# Patient Record
Sex: Male | Born: 1990 | Hispanic: Yes | Marital: Single | Smoking: Never smoker
Health system: Southern US, Community
[De-identification: ages and names within clinical notes are randomized; demographics above are authoritative.]

## PROBLEM LIST (undated history)

## (undated) HISTORY — PX: WISDOM TOOTH EXTRACTION: SHX21

---
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
## 2013-04-26 ENCOUNTER — Encounter (HOSPITAL_BASED_OUTPATIENT_CLINIC_OR_DEPARTMENT_OTHER): Payer: Self-pay | Admitting: Emergency Medicine

## 2013-04-26 ENCOUNTER — Emergency Department (HOSPITAL_BASED_OUTPATIENT_CLINIC_OR_DEPARTMENT_OTHER)
Admission: EM | Admit: 2013-04-26 | Discharge: 2013-04-26 | Disposition: A | Discharge status: Home / Self Care | Payer: No Typology Code available for payment source | Attending: Emergency Medicine | Admitting: Emergency Medicine

## 2013-04-26 ENCOUNTER — Emergency Department (HOSPITAL_BASED_OUTPATIENT_CLINIC_OR_DEPARTMENT_OTHER): Payer: No Typology Code available for payment source

## 2013-04-26 DIAGNOSIS — J069 Acute upper respiratory infection, unspecified: Secondary | ICD-10-CM | POA: Insufficient documentation

## 2013-04-26 DIAGNOSIS — R5381 Other malaise: Secondary | ICD-10-CM | POA: Insufficient documentation

## 2013-04-26 LAB — RAPID STREP SCREEN (MED CTR MEBANE ONLY): Streptococcus, Group A Screen (Direct): NEGATIVE

## 2013-04-26 MED ORDER — IBUPROFEN 600 MG PO TABS: 600.0000 mg | ORAL_TABLET | Freq: Four times a day (QID) | ORAL | Status: DC | PRN

## 2013-04-26 MED ORDER — BENZONATATE 100 MG PO CAPS: 100.0000 mg | ORAL_CAPSULE | Freq: Three times a day (TID) | ORAL | Status: DC

## 2013-04-26 MED ORDER — IBUPROFEN 400 MG PO TABS
600.0000 mg | ORAL_TABLET | Freq: Once | ORAL | Status: AC
  Administered 2013-04-26: 600 mg via ORAL
  Filled 2013-04-26 (×2): qty 1

## 2013-04-26 NOTE — ED Notes (Signed)
Pt reports cough, chest congestion, sore throat, fevers, fatigue, body aches x2 days

## 2013-04-26 NOTE — ED Provider Notes (Signed)
CSN: [id number]     Arrival date & time 04/26/13  0142 History   First MD Initiated Contact with Patient 04/26/13 0158     Chief Complaint  Patient presents with  . Cough  . Sore Throat   (Consider location/radiation/quality/duration/timing/severity/associated sxs/prior Treatment) HPI Patient is a 22 year old male who is normally healthy and presents with 48 hours of sore throat, subjective fevers, fatigue, nonproductive cough, body aches. Patient states he has a brother who had similar symptoms earlier in the week. He denies any neck pain or stiffness. He denies any headache. Patient has no shortness of breath or wheezing. Patient denies any abdominal pain, nausea, vomiting, diarrhea. Patient has been taking home remedies and Tylenol at at home for symptoms. History reviewed. No pertinent past medical history. History reviewed. No pertinent past surgical history. History reviewed. No pertinent family history. History  Substance Use Topics  . Smoking status: Never Smoker   . Smokeless tobacco: Never Used  . Alcohol Use: 0.6 oz/week    1 Cans of beer per week     Comment: twice a month socially    Review of Systems  Constitutional: Positive for fever, chills and fatigue.  HENT: Positive for sore throat. Negative for rhinorrhea, sinus pressure and trouble swallowing.   Respiratory: Positive for cough. Negative for shortness of breath and wheezing.   Cardiovascular: Negative for chest pain, palpitations and leg swelling.  Gastrointestinal: Negative for nausea, vomiting, abdominal pain, diarrhea and constipation.  Musculoskeletal: Negative for back pain, neck pain and neck stiffness.  Skin: Negative for rash.  Neurological: Negative for dizziness, syncope, weakness, light-headedness, numbness and headaches.  All other systems reviewed and are negative.    Allergies  Review of patient's allergies indicates no known allergies.  Home Medications  No current outpatient prescriptions  on file. BP 137/84  Pulse 82  Temp(Src) 97.9 F (36.6 C) (Oral)  Resp 18  Ht 5\' 11"  (1.803 m)  Wt 185 lb (83.915 kg)  BMI 25.81 kg/m2  SpO2 100% Physical Exam  Nursing note and vitals reviewed. Constitutional: He is oriented to person, place, and time. He appears well-developed and well-nourished. No distress.  HENT:  Head: Normocephalic and atraumatic.  Mouth/Throat: Oropharynx is clear and moist. No oropharyngeal exudate.  Patient with erythematous posterior oropharynx. No exudate noted.  Eyes: EOM are normal. Pupils are equal, round, and reactive to light.  Neck: Normal range of motion. Neck supple.  No meningismus. Mild anterior cervical lymphadenopathy.  Cardiovascular: Normal rate and regular rhythm.  Exam reveals no gallop and no friction rub.   No murmur heard. Pulmonary/Chest: Effort normal and breath sounds normal. No respiratory distress. He has no wheezes. He has no rales. He exhibits no tenderness.  Abdominal: Soft. Bowel sounds are normal. He exhibits no distension and no mass. There is no tenderness. There is no rebound and no guarding.  Musculoskeletal: Normal range of motion. He exhibits no edema and no tenderness.  No calf swelling or pain. Range of motion in all joints.  Lymphadenopathy:    He has cervical adenopathy.  Neurological: He is alert and oriented to person, place, and time.  Patient is alert and oriented x3 with clear, goal oriented speech. Patient has 5/5 motor in all extremities. Sensation is intact to light touch. Patient has a normal gait and walks without assistance.   Skin: Skin is warm and dry. No rash noted. No erythema.  Psychiatric: He has a normal mood and affect. His behavior is normal.    ED  Course  Procedures (including critical care time) Labs Review Labs Reviewed - No data to display Imaging Review No results found.  EKG Interpretation   None       MDM   Negative chest x-ray and strep screen. We'll treat symptomatically.  Patient given return precautions.   Loren Racer, MD 04/26/13 [id number]

## 2013-04-27 LAB — CULTURE, GROUP A STREP

## 2014-11-09 ENCOUNTER — Ambulatory Visit (INDEPENDENT_AMBULATORY_CARE_PROVIDER_SITE_OTHER): Payer: BLUE CROSS/BLUE SHIELD | Admitting: Family Medicine

## 2014-11-09 VITALS — BP 115/82 | HR 77 | Temp 99.1°F | Resp 14 | Ht 70.0 in | Wt 187.6 lb

## 2014-11-09 DIAGNOSIS — R05 Cough: Secondary | ICD-10-CM

## 2014-11-09 DIAGNOSIS — R059 Cough, unspecified: Secondary | ICD-10-CM

## 2014-11-09 DIAGNOSIS — J029 Acute pharyngitis, unspecified: Secondary | ICD-10-CM

## 2014-11-09 DIAGNOSIS — R52 Pain, unspecified: Secondary | ICD-10-CM | POA: Diagnosis not present

## 2014-11-09 LAB — POCT CBC
GRANULOCYTE PERCENT: 79.8 % (ref 37–80)
HCT, POC: 49.8 % (ref 43.5–53.7)
Hemoglobin: 16.9 g/dL (ref 14.1–18.1)
Lymph, poc: 3 (ref 0.6–3.4)
MCH, POC: 29.7 pg (ref 27–31.2)
MCHC: 34 g/dL (ref 31.8–35.4)
MCV: 87.2 fL (ref 80–97)
MID (cbc): 0.5 (ref 0–0.9)
MPV: 7.9 fL (ref 0–99.8)
POC Granulocyte: 13.6 — AB (ref 2–6.9)
POC LYMPH PERCENT: 17.5 %L (ref 10–50)
POC MID %: 2.7 % (ref 0–12)
Platelet Count, POC: 253 10*3/uL (ref 142–424)
RBC: 5.71 M/uL (ref 4.69–6.13)
RDW, POC: 13.2 %
WBC: 17 10*3/uL — AB (ref 4.6–10.2)

## 2014-11-09 LAB — POCT RAPID STREP A (OFFICE): RAPID STREP A SCREEN: NEGATIVE

## 2014-11-09 MED ORDER — CEFDINIR 300 MG PO CAPS: 300.0000 mg | ORAL_CAPSULE | Freq: Two times a day (BID) | ORAL | Status: DC

## 2014-11-09 NOTE — Patient Instructions (Signed)
It does appear that you have a bacterial infection- it is possible that you have pneumonia.  We are going to treat you with omnicef antibiotic twice a day for 10 days Let us know if you do not feel better in the next 1-2 days- Sooner if worse.   Ok to continue to use ibuprofen and/ or tylenol as needed for fever or other symptoms.

## 2014-11-09 NOTE — Progress Notes (Signed)
[facility] [address] [place] [address] [phone]  Date:  11/09/2014   Name:  Corey Freeman   DOB:  12-Dec-1990   MRN:  [id number]  PCP:  No PCP Per Patient    Chief Complaint: Ear Pain; Sore Throat; Cough; Generalized Body Aches; and Nasal Congestion   History of Present Illness:  Corey Freeman is a 24 y.o. very pleasant male patient who presents with the following:  Today is Wednesday. On Sunday he noted a scratchy throat and ringing in his ear.  His brother has recently been ill with pneumonia and he was not sure if this might be contagious.    At this time he has a prodictive cough, body aches.  He has a little bit of a ST now.  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  This am he noted he was coughing up material.  He took tylenol this am as well  He is generally in good health  NKDA No rash  There are no active problems to display for this patient.   History reviewed. No pertinent past medical history.  History reviewed. No pertinent past surgical history.  History  Substance Use Topics  . Smoking status: Never Smoker   . Smokeless tobacco: Never Used  . Alcohol Use: 0.6 oz/week    1 Cans of beer per week     Comment: twice a month socially    Family History  Problem Relation Age of Onset  . Diabetes Maternal Grandmother   . Hypertension Maternal Grandmother   . Diabetes Maternal Grandfather   . Diabetes Paternal Grandmother     No Known Allergies  Medication list has been reviewed and updated.  Current Outpatient Prescriptions on File Prior to Visit  Medication Sig Dispense Refill  . benzonatate (TESSALON) 100 MG capsule Take 1 capsule (100 mg total) by mouth every 8 (eight) hours. (Patient not taking: Reported on 11/09/2014) 21 capsule 0  . ibuprofen (ADVIL,MOTRIN) 600 MG tablet Take 1 tablet (600 mg total) by mouth every 6 (six) hours as needed. (Patient not taking: Reported on 11/09/2014) 30 tablet 0   No current facility-administered  medications on file prior to visit.    Review of Systems:  As per HPI- otherwise negative.   Physical Examination: Filed Vitals:   11/09/14 1310  BP: 120/74  Pulse: 98  Temp: 99.2 F (37.3 C)  Resp: 14   Filed Vitals:   11/09/14 1310  Height:  (1.778 m)  Weight: 187 lb 9.6 oz (85.095 kg)   Body mass index is 26.92 kg/(m^2). Ideal Body Weight: Weight in (lb) to have BMI = 25: 173.9  GEN: WDWN, NAD, Non-toxic, A & O x 3, looks well HEENT: Atraumatic, Normocephalic. Neck supple. No masses, No LAD.  Bilateral TM wnl, oropharynx injected but no exudate.  PEERL,EOMI.   Ears and Nose: No external deformity. CV: RRR, No M/G/R. No JVD. No thrill. No extra heart sounds. PULM: CTA B, no wheezes, crackles, rhonchi. No retractions. No resp. distress. No accessory muscle use. EXTR: No c/c/e NEURO Normal gait.  PSYCH: Normally interactive. Conversant. Not depressed or anxious appearing.  Calm demeanor.   Results for orders placed or performed in visit on 11/09/14  POCT rapid strep A  Result Value Ref Range   Rapid Strep A Screen Negative Negative  POCT CBC  Result Value Ref Range   WBC 17.0 (A) 4.6 - 10.2 K/uL   Lymph, poc 3.0 0.6 - 3.4  POC LYMPH PERCENT 17.5 10 - 50 %L   MID (cbc) 0.5 0 - 0.9   POC MID % 2.7 0 - 12 %M   POC Granulocyte 13.6 (A) 2 - 6.9   Granulocyte percent 79.8 37 - 80 %G   RBC 5.71 4.69 - 6.13 M/uL   Hemoglobin 16.9 14.1 - 18.1 g/dL   HCT, POC 16.149.8 09.643.5 - 53.7 %   MCV 87.2 80 - 97 fL   MCH, POC 29.7 27 - 31.2 pg   MCHC 34.0 31.8 - 35.4 g/dL   RDW, POC 04.513.2 %   Platelet Count, POC 253 142 - 424 K/uL   MPV 7.9 0 - 99.8 fL   Pt felt weak, sweaty after blood draw. No LOC.  Rested, drank juice/ gatorade and ate PB crackers (he had not eaten lunch and seen at approx 1:30 .  Felt better.  BP rechecked prior to DC home.  Dizziness resolved  Assessment and Plan: Acute pharyngitis, unspecified pharyngitis type - Plan: POCT rapid strep A, cefdinir  (OMNICEF) 300 MG capsule  Body aches - Plan: POCT rapid strep A, POCT CBC, cefdinir (OMNICEF) 300 MG capsule  Cough - Plan: cefdinir (OMNICEF) 300 MG capsule   Negative rapid step but sx of illness and significant leukocytosis.  Will treat with omnicef for likely bacterial infection.  He will follow-up if not better in the next 1-2 days- Sooner if worse.      Signed Abbe AmsterdamJessica Copland, MD

## 2014-11-11 ENCOUNTER — Telehealth: Payer: Self-pay | Admitting: Family Medicine

## 2014-11-11 NOTE — Telephone Encounter (Signed)
Called to check on how he is doing, LMOM.  Please let us know if not better

## 2015-01-02 ENCOUNTER — Ambulatory Visit (INDEPENDENT_AMBULATORY_CARE_PROVIDER_SITE_OTHER): Payer: BLUE CROSS/BLUE SHIELD | Admitting: Physician Assistant

## 2015-01-02 VITALS — BP 122/70 | HR 76 | Temp 98.4°F | Resp 12 | Ht 71.5 in | Wt 190.6 lb

## 2015-01-02 DIAGNOSIS — H109 Unspecified conjunctivitis: Secondary | ICD-10-CM | POA: Diagnosis not present

## 2015-01-02 NOTE — Patient Instructions (Signed)
Apply warm/cool compresses. Rewetting or lubricating eye drops. Do no apply any other eye drops. Practice good hand hygiene. Return if symptoms are not improving in 1 week. Return at any time if you develop eye pain, blurred vision, facial swelling, fever or chills. Viral Conjunctivitis Conjunctivitis is an irritation (inflammation) of the clear membrane that covers the white part of the eye (the conjunctiva). The irritation can also happen on the underside of the eyelids. Conjunctivitis makes the eye red or pink in color. This is what is commonly known as pink eye. Viral conjunctivitis can spread easily (contagious). CAUSES   Infection from virus on the surface of the eye.  Infection from the irritation or injury of nearby tissues such as the eyelids or cornea.  More serious inflammation or infection on the inside of the eye.  Other eye diseases.  The use of certain eye medications. SYMPTOMS  The normally white color of the eye or the underside of the eyelid is usually pink or red in color. The pink eye is usually associated with irritation, tearing and some sensitivity to light. Viral conjunctivitis is often associated with a clear, watery discharge. If a discharge is present, there may also be some blurred vision in the affected eye. DIAGNOSIS  Conjunctivitis is diagnosed by an eye exam. The eye specialist looks for changes in the surface tissues of the eye which take on changes characteristic of the specific types of conjunctivitis. A sample of any discharge may be collected on a Q-Tip (sterile swap). The sample will be sent to a lab to see whether or not the inflammation is caused by bacterial or viral infection. TREATMENT  Viral conjunctivitis will not respond to medicines that kill germs (antibiotics). Treatment is aimed at stopping a bacterial infection on top of the viral infection. The goal of treatment is to relieve symptoms (such as itching) with antihistamine drops or other eye  medications.  HOME CARE INSTRUCTIONS   To ease discomfort, apply a cool, clean wash cloth to your eye for 10 to 20 minutes, 3 to 4 times a day.  Gently wipe away any drainage from the eye with a warm, wet washcloth or a cotton ball.  Wash your hands often with soap and use paper towels to dry.  Do not share towels or washcloths. This may spread the infection.  Change or wash your pillowcase every day.  You should not use eye make-up until the infection is gone.  Stop using contacts lenses. Ask your eye professional how to sterilize or replace them before using again. This depends on the type of contact lenses used.  Do not touch the edge of the eyelid with the eye drop bottle or ointment tube when applying medications to the affected eye. This will stop you from spreading the infection to the other eye or to others. SEEK IMMEDIATE MEDICAL CARE IF:   The infection has not improved within 3 days of beginning treatment.  A watery discharge from the eye develops.  Pain in the eye increases.  The redness is spreading.  Vision becomes blurred.  An oral temperature above 102 F (38.9 C) develops, or as your caregiver suggests.  Facial pain, redness or swelling develops.  Any problems that may be related to the prescribed medicine develop. MAKE SURE YOU:   Understand these instructions.  Will watch your condition.  Will get help right away if you are not doing well or get worse. Document Released: 06/03/2005 Document Revised: 08/26/2011 Document Reviewed: 01/21/2008 Exit[employer] Patient Information  2015 [employer]. This information is not intended to replace advice given to you by your health care provider. Make sure you discuss any questions you have with your health care provider.  

## 2015-01-02 NOTE — Progress Notes (Signed)
[facility] [address], [place] [address] [phone]  Date:  01/02/2015   Name:  Corey Freeman   DOB:  Jan 23, 1991   MRN:  [id number]  PCP:  No PCP Per Patient    Chief Complaint: Eye Problem   History of Present Illness:  This is a 24 y.o. male who is presenting with a swollen, red and itchy right eye x 24 hours. States at start of symptoms, eye felt very dry. He has tried saline drops and and clear eye drops and no help. This morning he woke with eyelid crusting. No discharge since. Reports his eye is very itchy but no pain. Feels there is something "like an eyelash" stuck in his eye and can't get it out. No change in vision. Does not wear glasses or contacts. He denies any other symptoms - no nasal congestion, sore throat, cough, otalgia, fever or chills.  Review of Systems:  Review of Systems See HPI  There are no active problems to display for this patient.   Prior to Admission medications   Not on File    No Known Allergies  History reviewed. No pertinent past surgical history.  History  Substance Use Topics  . Smoking status: Never Smoker   . Smokeless tobacco: Never Used  . Alcohol Use: 0.6 oz/week    1 Cans of beer per week     Comment: twice a month socially    Family History  Problem Relation Age of Onset  . Diabetes Maternal Grandmother   . Hypertension Maternal Grandmother   . Diabetes Maternal Grandfather   . Diabetes Paternal Grandmother     Medication list has been reviewed and updated.  Physical Examination:  Physical Exam  Constitutional: He is oriented to person, place, and time. He appears well-developed and well-nourished. No distress.  HENT:  Head: Normocephalic and atraumatic.  Right Ear: Hearing normal.  Left Ear: Hearing normal.  Nose: Nose normal.  Mouth/Throat: Uvula is midline, oropharynx is clear and moist and mucous membranes are normal.  Eyes: EOM and lids are normal. Pupils are equal, round, and  reactive to light. Right eye exhibits no discharge. Left eye exhibits no discharge. Right conjunctiva is injected. Left conjunctiva is not injected. No scleral icterus.  Mild swelling of medial lower lid Woods lamp: no corneal uptake  Cardiovascular: Normal rate, regular rhythm and normal pulses.   Pulmonary/Chest: Effort normal. No respiratory distress.  Musculoskeletal: Normal range of motion.  Lymphadenopathy:       Head (right side): No submental, no submandibular and no tonsillar adenopathy present.       Head (left side): No submental, no submandibular and no tonsillar adenopathy present.    He has no cervical adenopathy.  Neurological: He is alert and oriented to person, place, and time.  Skin: Skin is warm, dry and intact. No lesion and no rash noted.  Psychiatric: He has a normal mood and affect. His speech is normal and behavior is normal. Thought content normal.   BP 122/70 mmHg  Pulse 76  Temp(Src) 98.4 F (36.9 C) (Oral)  Resp 12  Ht 5' 11.5" (1.816 m)  Wt 190 lb 9.6 oz (86.456 kg)  BMI 26.22 kg/m2  SpO2 98%   Visual Acuity Screening   Right eye Left eye Both eyes  Without correction:  With correction:      Assessment and Plan:  1. Conjunctivitis of right eye Etiology likely viral. Counseled on cool/warm compresses, lubricating eye  drops and hand hygiene. Return in 1 week if symptoms not improving or at any time if symptoms worsen.   Roswell MinersNicole V. Dyke BrackettBush, PA-C, MHS [facility]  01/02/2015

## 2015-01-03 NOTE — Progress Notes (Signed)
  Medical screening examination/treatment/procedure(s) were performed by non-physician practitioner and as supervising physician I was immediately available for consultation/collaboration.     

## 2015-06-20 ENCOUNTER — Emergency Department (HOSPITAL_BASED_OUTPATIENT_CLINIC_OR_DEPARTMENT_OTHER)
Admission: EM | Admit: 2015-06-20 | Discharge: 2015-06-20 | Disposition: A | Discharge status: Home / Self Care | Payer: BLUE CROSS/BLUE SHIELD | Attending: Emergency Medicine | Admitting: Emergency Medicine

## 2015-06-20 ENCOUNTER — Emergency Department (HOSPITAL_BASED_OUTPATIENT_CLINIC_OR_DEPARTMENT_OTHER): Payer: BLUE CROSS/BLUE SHIELD

## 2015-06-20 ENCOUNTER — Encounter (HOSPITAL_BASED_OUTPATIENT_CLINIC_OR_DEPARTMENT_OTHER): Payer: Self-pay

## 2015-06-20 DIAGNOSIS — R110 Nausea: Secondary | ICD-10-CM | POA: Diagnosis not present

## 2015-06-20 DIAGNOSIS — R202 Paresthesia of skin: Secondary | ICD-10-CM | POA: Diagnosis not present

## 2015-06-20 DIAGNOSIS — R51 Headache: Secondary | ICD-10-CM | POA: Insufficient documentation

## 2015-06-20 DIAGNOSIS — M79642 Pain in left hand: Secondary | ICD-10-CM | POA: Insufficient documentation

## 2015-06-20 DIAGNOSIS — E876 Hypokalemia: Secondary | ICD-10-CM | POA: Diagnosis not present

## 2015-06-20 DIAGNOSIS — R42 Dizziness and giddiness: Secondary | ICD-10-CM | POA: Diagnosis not present

## 2015-06-20 DIAGNOSIS — M791 Myalgia: Secondary | ICD-10-CM | POA: Diagnosis not present

## 2015-06-20 LAB — CBC WITH DIFFERENTIAL/PLATELET
BASOS ABS: 0.1 10*3/uL (ref 0.0–0.1)
Basophils Relative: 1 %
EOS PCT: 2 %
Eosinophils Absolute: 0.3 10*3/uL (ref 0.0–0.7)
HCT: 44.8 % (ref 39.0–52.0)
HEMOGLOBIN: 15.5 g/dL (ref 13.0–17.0)
LYMPHS PCT: 41 %
Lymphs Abs: 5.9 10*3/uL — ABNORMAL HIGH (ref 0.7–4.0)
MCH: 29.6 pg (ref 26.0–34.0)
MCHC: 34.6 g/dL (ref 30.0–36.0)
MCV: 85.7 fL (ref 78.0–100.0)
MONOS PCT: 11 %
Monocytes Absolute: 1.6 10*3/uL — ABNORMAL HIGH (ref 0.1–1.0)
Neutro Abs: 6.6 10*3/uL (ref 1.7–7.7)
Neutrophils Relative %: 45 %
Platelets: 292 10*3/uL (ref 150–400)
RBC: 5.23 MIL/uL (ref 4.22–5.81)
RDW: 13.5 % (ref 11.5–15.5)
WBC: 14.5 10*3/uL — AB (ref 4.0–10.5)

## 2015-06-20 LAB — BASIC METABOLIC PANEL
ANION GAP: 10 (ref 5–15)
BUN: 18 mg/dL (ref 6–20)
CO2: 23 mmol/L (ref 22–32)
CREATININE: 0.99 mg/dL (ref 0.61–1.24)
Calcium: 8.9 mg/dL (ref 8.9–10.3)
Chloride: 104 mmol/L (ref 101–111)
GFR calc Af Amer: >60 mL/min (ref >60)
Glucose, Bld: 133 mg/dL — ABNORMAL HIGH (ref 65–99)
POTASSIUM: 2.9 mmol/L — AB (ref 3.5–5.1)
SODIUM: 137 mmol/L (ref 135–145)

## 2015-06-20 LAB — RAPID URINE DRUG SCREEN, HOSP PERFORMED
Amphetamines: NOT DETECTED
Barbiturates: NOT DETECTED
Benzodiazepines: NOT DETECTED
COCAINE: NOT DETECTED
Opiates: NOT DETECTED
TETRAHYDROCANNABINOL: NOT DETECTED

## 2015-06-20 MED ORDER — POTASSIUM CHLORIDE CRYS ER 20 MEQ PO TBCR
40.0000 meq | EXTENDED_RELEASE_TABLET | Freq: Once | ORAL | Status: AC
  Administered 2015-06-20: 40 meq via ORAL
  Filled 2015-06-20: qty 2

## 2015-06-20 MED ORDER — ONDANSETRON 4 MG PO TBDP
4.0000 mg | ORAL_TABLET | Freq: Once | ORAL | Status: AC
  Administered 2015-06-20: 4 mg via ORAL
  Filled 2015-06-20: qty 1

## 2015-06-20 MED ORDER — SODIUM CHLORIDE 0.9 % IV BOLUS (SEPSIS)
1000.0000 mL | Freq: Once | INTRAVENOUS | Status: AC
  Administered 2015-06-20: 1000 mL via INTRAVENOUS

## 2015-06-20 MED ORDER — MECLIZINE HCL 25 MG PO TABS
25.0000 mg | ORAL_TABLET | Freq: Once | ORAL | Status: AC
  Administered 2015-06-20: 25 mg via ORAL
  Filled 2015-06-20: qty 1

## 2015-06-20 MED ORDER — MECLIZINE HCL 25 MG PO TABS: 25.0000 mg | ORAL_TABLET | Freq: Three times a day (TID) | ORAL | Status: DC | PRN

## 2015-06-20 MED FILL — MECLIZINE 25 MG TABLET: 25 | 10 days supply | Qty: 30 | Fill #0

## 2015-06-20 NOTE — ED Notes (Signed)
Pt reports 1.5 hrs ago he had a cup of coffee on the couch, states he got up and became dizzy, lightheaded. Has associated left arm pain, nausea, "states the room is spinning and my legs are like jelly."

## 2015-06-20 NOTE — ED Notes (Signed)
Patient transported to CT 

## 2015-06-20 NOTE — ED Notes (Signed)
Pt. Has episode of dizziness and vision disturbance with bicep numbness in the L bicep while RN at bedside.  RN Earlene Plateravis stayed with Pt.  Pt. HR at 101 with no distress noted in other vitals.  Pt. Status reported to Dr. Wilkie AyeHorton.  New orders place.

## 2015-06-20 NOTE — Discharge Instructions (Signed)
Benign Positional Vertigo °Vertigo is the feeling that you or your surroundings are moving when they are not. Benign positional vertigo is the most common form of vertigo. The cause of this condition is not serious (is benign). This condition is triggered by certain movements and positions (is positional). This condition can be dangerous if it occurs while you are doing something that could endanger you or others, such as driving.  °CAUSES °In many cases, the cause of this condition is not known. It may be caused by a disturbance in an area of the inner ear that helps your brain to sense movement and balance. This disturbance can be caused by a viral infection (labyrinthitis), head injury, or repetitive motion. °RISK FACTORS °This condition is more likely to develop in: °· Women. °· People who are 50 years of age or older. °SYMPTOMS °Symptoms of this condition usually happen when you move your head or your eyes in different directions. Symptoms may start suddenly, and they usually last for less than a minute. Symptoms may include: °· Loss of balance and falling. °· Feeling like you are spinning or moving. °· Feeling like your surroundings are spinning or moving. °· Nausea and vomiting. °· Blurred vision. °· Dizziness. °· Involuntary eye movement (nystagmus). °Symptoms can be mild and cause only slight annoyance, or they can be severe and interfere with daily life. Episodes of benign positional vertigo may return (recur) over time, and they may be triggered by certain movements. Symptoms may improve over time. °DIAGNOSIS °This condition is usually diagnosed by medical history and a physical exam of the head, neck, and ears. You may be referred to a health care provider who specializes in ear, nose, and throat (ENT) problems (otolaryngologist) or a provider who specializes in disorders of the nervous system (neurologist). You may have additional testing, including: °· MRI. °· A CT scan. °· Eye movement tests. Your  health care provider may ask you to change positions quickly while he or she watches you for symptoms of benign positional vertigo, such as nystagmus. Eye movement may be tested with an electronystagmogram (ENG), caloric stimulation, the Dix-Hallpike test, or the roll test. °· An electroencephalogram (EEG). This records electrical activity in your brain. °· Hearing tests. °TREATMENT °Usually, your health care provider will treat this by moving your head in specific positions to adjust your inner ear back to normal. Surgery may be needed in severe cases, but this is rare. In some cases, benign positional vertigo may resolve on its own in 2-4 weeks. °HOME CARE INSTRUCTIONS °Safety °· Move slowly. Avoid sudden body or head movements. °· Avoid driving. °· Avoid operating heavy machinery. °· Avoid doing any tasks that would be dangerous to you or others if a vertigo episode would occur. °· If you have trouble walking or keeping your balance, try using a cane for stability. If you feel dizzy or unstable, sit down right away. °· Return to your normal activities as told by your health care provider. Ask your health care provider what activities are safe for you. °General Instructions °· Take over-the-counter and prescription medicines only as told by your health care provider. °· Avoid certain positions or movements as told by your health care provider. °· Drink enough fluid to keep your urine clear or pale yellow. °· Keep all follow-up visits as told by your health care provider. This is important. °SEEK MEDICAL CARE IF: °· You have a fever. °· Your condition gets worse or you develop new symptoms. °· Your family or friends   notice any behavioral changes.  Your nausea or vomiting gets worse.  You have numbness or a "pins and needles" sensation. SEEK IMMEDIATE MEDICAL CARE IF:  You have difficulty speaking or moving.  You are always dizzy.  You faint.  You develop severe headaches.  You have weakness in your  legs or arms.  You have changes in your hearing or vision.  You develop a stiff neck.  You develop sensitivity to light.   This information is not intended to replace advice given to you by your health care provider. Make sure you discuss any questions you have with your health care provider.   Document Released: 03/11/2006 Document Revised: 02/22/2015 Document Reviewed: 09/26/2014 Elsevier Interactive Patient Education 2016 [employer] Hypokalemia Hypokalemia means that the amount of potassium in the blood is lower than normal.Potassium is a chemical, called an electrolyte, that helps regulate the amount of fluid in the body. It also stimulates muscle contraction and helps nerves function properly.Most of the body's potassium is inside of cells, and only a very small amount is in the blood. Because the amount in the blood is so small, minor changes can be life-threatening. CAUSES  Antibiotics.  Diarrhea or vomiting.  Using laxatives too much, which can cause diarrhea.  Chronic kidney disease.  Water pills (diuretics).  Eating disorders (bulimia).  Low magnesium level.  Sweating a lot. SIGNS AND SYMPTOMS  Weakness.  Constipation.  Fatigue.  Muscle cramps.  Mental confusion.  Skipped heartbeats or irregular heartbeat (palpitations).  Tingling or numbness. DIAGNOSIS  Your health care provider can diagnose hypokalemia with blood tests. In addition to checking your potassium level, your health care provider may also check other lab tests. TREATMENT Hypokalemia can be treated with potassium supplements taken by mouth or adjustments in your current medicines. If your potassium level is very low, you may need to get potassium through a vein (IV) and be monitored in the hospital. A diet high in potassium is also helpful. Foods high in potassium are:  Nuts, such as peanuts and pistachios.  Seeds, such as sunflower seeds and pumpkin seeds.  Peas, lentils, and lima  beans.  Whole grain and bran cereals and breads.  Fresh fruit and vegetables, such as apricots, avocado, bananas, cantaloupe, kiwi, oranges, tomatoes, asparagus, and potatoes.  Orange and tomato juices.  Red meats.  Fruit yogurt. HOME CARE INSTRUCTIONS  Take all medicines as prescribed by your health care provider.  Maintain a healthy diet by including nutritious food, such as fruits, vegetables, nuts, whole grains, and lean meats.  If you are taking a laxative, be sure to follow the directions on the label. SEEK MEDICAL CARE IF:  Your weakness gets worse.  You feel your heart pounding or racing.  You are vomiting or having diarrhea.  You are diabetic and having trouble keeping your blood glucose in the normal range. SEEK IMMEDIATE MEDICAL CARE IF:  You have chest pain, shortness of breath, or dizziness.  You are vomiting or having diarrhea for more than 2 days.  You faint. MAKE SURE YOU:   Understand these instructions.  Will watch your condition.  Will get help right away if you are not doing well or get worse.   This information is not intended to replace advice given to you by your health care provider. Make sure you discuss any questions you have with your health care provider.   Document Released: 06/03/2005 Document Revised: 06/24/2014 Document Reviewed: 12/04/2012 Elsevier Interactive Patient Education [employer] Elsevier Inc.

## 2015-06-20 NOTE — ED Provider Notes (Signed)
CSN: [id number]     Arrival date & time 06/20/15  0022 History  By signing my name below, I, Corey Freeman, attest that this documentation has been prepared under the direction and in the presence of Shon Baton, MD. Electronically Signed: Budd Freeman, ED Scribe. 06/20/2015. 12:51 AM.     Chief Complaint  Patient presents with  . Dizziness   The history is provided by the patient. No language interpreter was used.   HPI Comments: Corey Freeman is a 25 y.o. male who presents to the Emergency Department complaining of room-spinning dizziness onset 20 minutes ago. Pt states he was sitting on the couch drinking a coffee when he stood up and felt dizzy and lightheaded. Patient reports room spinning dizziness. Has had several episodes similar to this in the past. Mother with a history of vertigo. He reports associated weakness, pain and tingling in his left biceps, and nausea. He also endorses occasional headaches after standing up rapidly. He notes he typically drinks coffee once or twice per week. He reports a FHx of vertigo (mother). He denies exacerbation of the dizziness with head movement, but states that sometimes when lying supine in the past, he also felt dizzy. He denies recent illness or hard work in the heat. He denies recent travel or injury as well as a PMHx of DVT or PE. Pt denies vomiting, fever, and cough. Denies vision changes.  History reviewed. No pertinent past medical history. History reviewed. No pertinent past surgical history. Family History  Problem Relation Age of Onset  . Diabetes Maternal Grandmother   . Hypertension Maternal Grandmother   . Diabetes Maternal Grandfather   . Diabetes Paternal Grandmother    Social History  Substance Use Topics  . Smoking status: Never Smoker   . Smokeless tobacco: Never Used  . Alcohol Use: 0.6 oz/week    1 Cans of beer per week     Comment: twice a month socially    Review of Systems  Constitutional: Negative for fever.   Eyes: Negative for visual disturbance.  Respiratory: Negative for cough.   Gastrointestinal: Positive for nausea. Negative for vomiting.  Musculoskeletal: Positive for myalgias.  Neurological: Positive for dizziness and light-headedness.  All other systems reviewed and are negative.   Allergies  Review of patient's allergies indicates no known allergies.  Home Medications   Prior to Admission medications   Medication Sig Start Date End Date Taking? Authorizing Provider  meclizine (ANTIVERT) 25 MG tablet Take 1 tablet (25 mg total) by mouth 3 (three) times daily as needed for dizziness. 06/20/15   Shon Baton, MD   BP 121/85 mmHg  Pulse 88  Temp(Src) 98.1 F (36.7 C) (Oral)  Resp 19  Ht 5\' 10"  (1.778 m)  Wt 190 lb (86.183 kg)  BMI 27.26 kg/m2  SpO2 97% Physical Exam  Constitutional: He is oriented to person, place, and time. He appears well-developed and well-nourished. No distress.  HENT:  Head: Normocephalic and atraumatic.  Right Ear: External ear normal.  Left Ear: External ear normal.  Mouth/Throat: Oropharynx is clear and moist.  Eyes: EOM are normal. Pupils are equal, round, and reactive to light.  No nystagmus appreciated  Cardiovascular: Normal rate, regular rhythm and normal heart sounds.   No murmur heard. Pulmonary/Chest: Effort normal and breath sounds normal. No respiratory distress. He has no wheezes.  Abdominal: Soft. Bowel sounds are normal. There is no tenderness. There is no rebound.  Musculoskeletal: He exhibits no edema.  Neurological: He is alert  and oriented to person, place, and time.  No dysmetria to finger-nose-finger, no drift noted, cranial nerves II through XII intact  Skin: Skin is warm and dry.  Psychiatric: He has a normal mood and affect.  Nursing note and vitals reviewed.   ED Course  Procedures  DIAGNOSTIC STUDIES: Oxygen Saturation is 99% on RA, normal by my interpretation.    COORDINATION OF CARE: 12:49 AM - Discussed  possible vertigo. Discussed plans to order diagnostic studies. Pt advised of plan for treatment and pt agrees.  Labs Review Labs Reviewed  CBC WITH DIFFERENTIAL/PLATELET - Abnormal; Notable for the following:    WBC 14.5 (*)    Lymphs Abs 5.9 (*)    Monocytes Absolute 1.6 (*)    All other components within normal limits  BASIC METABOLIC PANEL - Abnormal; Notable for the following:    Potassium 2.9 (*)    Glucose, Bld 133 (*)    All other components within normal limits  URINE RAPID DRUG SCREEN, HOSP PERFORMED    Imaging Review Ct Head Wo Contrast  06/20/2015  CLINICAL DATA:  Acute onset of dizziness, blurred vision and nausea. Initial encounter. EXAM: CT HEAD WITHOUT CONTRAST TECHNIQUE: Contiguous axial images were obtained from the base of the skull through the vertex without intravenous contrast. COMPARISON:  None. FINDINGS: There is no evidence of acute infarction, mass lesion, or intra- or extra-axial hemorrhage on CT. The posterior fossa, including the cerebellum, brainstem and fourth ventricle, is within normal limits. The third and lateral ventricles, and basal ganglia are unremarkable in appearance. The cerebral hemispheres are symmetric in appearance, with normal gray-white differentiation. No mass effect or midline shift is seen. There is no evidence of fracture; visualized osseous structures are unremarkable in appearance. The visualized portions of the orbits are within normal limits. The paranasal sinuses and mastoid air cells are well-aerated. No significant soft tissue abnormalities are seen. IMPRESSION: Unremarkable noncontrast CT of the head. Electronically Signed   By: Roanna RaiderJeffery  Chang M.D.   On: 06/20/2015 02:12   I have personally reviewed and evaluated these images and lab results as part of my medical decision-making.   EKG Interpretation   Date/Time:  Tuesday June 20 2015 00:36:55 EST Ventricular Rate:  100 PR Interval:  156 QRS Duration: 95 QT Interval:  347 QTC  Calculation: 447 R Axis:   78 Text Interpretation:  Sinus tachycardia Confirmed by Adlai Nieblas  MD, Erlinda Solinger  ([id number]) on 06/20/2015 12:59:57 AM      MDM   Final diagnoses:  Vertigo  Hypokalemia   Patient presents with room spinning dizziness. Nontoxic on exam. Nonfocal. Reports recurrent symptoms previously. Neurologically intact. No cerebellar dysfunction. Suspect peripheral vertigo. Patient given meclizine and Zofran.  Given tingling in left arm, basic labwork obtained to assess metabolic derangement. Potassium low at 2.9. This was replaced. EKG reassuring. Patient is not orthostatic.  1:19 AM Was just informed by nursing that patient complaining of worsening dizziness. Unable to perform orthostatics. Also reports blurred vision. Patient just received meclizine.  2:47 AM CT negative. Patient reports improvement of dizziness. He is able to ambulate without difficulty. Suspect vertigo. Discussed with patient treatment. Patient was also encouraged to eat high potassium foods given his mild hypokalemia. Patient stated understanding. He will be given ENT follow-up.  After history, exam, and medical workup I feel the patient has been appropriately medically screened and is safe for discharge home. Pertinent diagnoses were discussed with the patient. Patient was given return precautions.  I personally performed the services  described in this documentation, which was scribed in my presence. The recorded information has been reviewed and is accurate.   Shon Baton, MD 06/20/15 [phone]

## 2015-06-24 ENCOUNTER — Emergency Department (HOSPITAL_BASED_OUTPATIENT_CLINIC_OR_DEPARTMENT_OTHER)
Admission: EM | Admit: 2015-06-24 | Discharge: 2015-06-25 | Disposition: A | Discharge status: Home / Self Care | Payer: BLUE CROSS/BLUE SHIELD | Attending: Emergency Medicine | Admitting: Emergency Medicine

## 2015-06-24 DIAGNOSIS — M25522 Pain in left elbow: Secondary | ICD-10-CM | POA: Diagnosis present

## 2015-06-24 DIAGNOSIS — E876 Hypokalemia: Secondary | ICD-10-CM | POA: Diagnosis not present

## 2015-06-24 DIAGNOSIS — M79602 Pain in left arm: Secondary | ICD-10-CM | POA: Diagnosis not present

## 2015-06-25 ENCOUNTER — Encounter (HOSPITAL_BASED_OUTPATIENT_CLINIC_OR_DEPARTMENT_OTHER): Payer: Self-pay | Admitting: *Deleted

## 2015-06-25 LAB — MAGNESIUM: MAGNESIUM: 2.2 mg/dL (ref 1.7–2.4)

## 2015-06-25 LAB — BASIC METABOLIC PANEL
Anion gap: 8 (ref 5–15)
BUN: 16 mg/dL (ref 6–20)
CHLORIDE: 104 mmol/L (ref 101–111)
CO2: 29 mmol/L (ref 22–32)
Calcium: 10.1 mg/dL (ref 8.9–10.3)
Creatinine, Ser: 0.77 mg/dL (ref 0.61–1.24)
GFR calc Af Amer: >60 mL/min (ref >60)
Glucose, Bld: 99 mg/dL (ref 65–99)
POTASSIUM: 3.7 mmol/L (ref 3.5–5.1)
SODIUM: 141 mmol/L (ref 135–145)

## 2015-06-25 NOTE — ED Provider Notes (Signed)
CSN: [id number]     Arrival date & time 06/24/15  2343 History   First MD Initiated Contact with Patient 06/25/15 0015     Chief Complaint  Patient presents with  . Arm Pain     (Consider location/radiation/quality/duration/timing/severity/associated sxs/prior Treatment) HPI  This is a healthy 25 year old male who was seen in the department on the third of this month for dizziness and pain in his left elbow. He was diagnosed with vertigo and hypokalemia. He was given 1 dose of potassium chloride in the department and given a prescription for meclizine. His vertigo has improved.   He returns with episodic pain in his left elbow, primarily in the antecubital fossa but radiating to the olecranon as well. These episodes last about 30 minutes. Nothing brings them on or makes them better. There is no tenderness, swelling or pain with movement of the elbow, left shoulder or neck. He has also been having occasional jerks in his left pectoralis major. He rates his pain as a 3 out of 10 at its worst. He denies fever, chills, chest pain, shortness of breath, nausea, vomiting, diarrhea or trauma.  History reviewed. No pertinent past medical history. History reviewed. No pertinent past surgical history. Family History  Problem Relation Age of Onset  . Diabetes Maternal Grandmother   . Hypertension Maternal Grandmother   . Diabetes Maternal Grandfather   . Diabetes Paternal Grandmother    Social History  Substance Use Topics  . Smoking status: Never Smoker   . Smokeless tobacco: Never Used  . Alcohol Use: 0.6 oz/week    1 Cans of beer per week     Comment: twice a month socially    Review of Systems  All other systems reviewed and are negative.   Allergies  Review of patient's allergies indicates no known allergies.  Home Medications   Prior to Admission medications   Medication Sig Start Date End Date Taking? Authorizing Provider  meclizine (ANTIVERT) 25 MG tablet Take 1 tablet (25 mg  total) by mouth 3 (three) times daily as needed for dizziness. 06/20/15   Shon Baton, MD   BP 148/81 mmHg  Pulse 92  Temp(Src) 99 F (37.2 C) (Oral)  Resp 16  Ht 5\' 10"  (1.778 m)  Wt 193 lb (87.544 kg)  BMI 27.69 kg/m2  SpO2 99%   Physical Exam  General: Well-developed, well-nourished male in no acute distress; appearance consistent with age of record HENT: normocephalic; atraumatic Eyes: pupils equal, round and reactive to light; extraocular muscles intact Neck: supple Heart: regular rate and rhythm Lungs: clear to auscultation bilaterally Abdomen: soft; nondistended; nontender; bowel sounds present Extremities: No deformity; full range of motion; pulses normal; no tenderness or swelling of the musculature of the left arm, no bony tenderness of the left elbow, no pain on passive range of motion; no tenderness of the left pectoralis muscles Neurologic: Awake, alert and oriented; motor function intact in all extremities and symmetric; no facial droop Skin: Warm and dry Psychiatric: Normal mood and affect   ED Course  Procedures (including critical care time)   MDM  Nursing notes and vitals signs, including pulse oximetry, reviewed.  Summary of this visit's results, reviewed by myself:  Labs:  Results for orders placed or performed during the hospital encounter of 06/24/15 (from the past 24 hour(s))  Basic metabolic panel     Status: None   Collection Time: 06/25/15 12:30 AM  Result Value Ref Range   Sodium 141 135 - 145 mmol/L  Potassium 3.7 3.5 - 5.1 mmol/L   Chloride 104 101 - 111 mmol/L   CO2 29 22 - 32 mmol/L   Glucose, Bld 99 65 - 99 mg/dL   BUN 16 6 - 20 mg/dL   Creatinine, Ser 7.820.77 0.61 - 1.24 mg/dL   Calcium 95.610.1 8.9 - 21.310.3 mg/dL   GFR calc non Af Amer >60 >60 mL/min   GFR calc Af Amer >60 >60 mL/min   Anion gap 8 5 - 15  Magnesium     Status: None   Collection Time: 06/25/15 12:30 AM  Result Value Ref Range   Magnesium 2.2 1.7 - 2.4 mg/dL    Patient advised of improved potassium level. The cause of his pain is not obvious at this time. He has a follow-up appointment scheduled for the day after tomorrow at [facility]   Paula LibraJohn Autumnrose Yore, MD 06/25/15 [phone]

## 2015-06-25 NOTE — ED Notes (Signed)
Denies questions or needs, alert, NAD< calm, VSS.

## 2015-06-25 NOTE — ED Notes (Addendum)
C/o L distal bicep and tricep pain, sometimes feels it in his L axillary pectoralis muscle, onset 1 hr ago, constant, not affected by use movement or palpation. Occurred last week also. Reports was seen here for the same in addition to dizziness last week. Dizziness resolved/ improved. (denies: ice, meds or topicals applied). Mentions "lifts some minimal weights maybe once per week". States, "has tried to hydrate and increase potassium in diet". (Denies: nvd, fever, dizziness, spasms/ cramping, numbness/ tingling, known strain or injury.

## 2015-06-27 ENCOUNTER — Ambulatory Visit (INDEPENDENT_AMBULATORY_CARE_PROVIDER_SITE_OTHER): Payer: BLUE CROSS/BLUE SHIELD | Admitting: Family Medicine

## 2015-06-27 ENCOUNTER — Encounter: Payer: Self-pay | Admitting: Family Medicine

## 2015-06-27 VITALS — BP 133/74 | HR 94 | Temp 99.7°F | Resp 16 | Ht 70.0 in | Wt 196.0 lb

## 2015-06-27 DIAGNOSIS — H8110 Benign paroxysmal vertigo, unspecified ear: Secondary | ICD-10-CM

## 2015-06-27 DIAGNOSIS — E876 Hypokalemia: Secondary | ICD-10-CM | POA: Diagnosis not present

## 2015-06-27 NOTE — Progress Notes (Signed)
   Subjective:    Patient ID: Corey Freeman, male    DOB: 02/22/1991, 25 y.o.   MRN: [id number]  HPI This is a pleasant 25 yo male who is accompanied by his girlfriend. He presents for follow up of recent ED visit and hypokalemia.   He was seen 06/20/15 in the ED with vertigo. Head CT was negative. At that time he was found to have a potassium of 2.9. He has had 2 more episodes of vertigo relieved with meclazine. Subsequent episodes were shorter than initial episode. He had feelings of weakness in his legs which have now resolved.   He was seen 06/24/15 with left arm pain, he was concerned this was related to his heart. He had normal EKG, negative troponin. Repeat potassium 3.7. Arm pain since resolved.   No past medical history on file. No past surgical history on file. Family History  Problem Relation Age of Onset  . Diabetes Maternal Grandmother   . Hypertension Maternal Grandmother   . Diabetes Maternal Grandfather   . Diabetes Paternal Grandmother    Social History  Substance Use Topics  . Smoking status: Never Smoker   . Smokeless tobacco: Never Used  . Alcohol Use: 0.6 oz/week    1 Cans of beer per week     Comment: twice a month socially   Medications, allergies, past medical history, surgical history, family history, social history and problem list reviewed and updated.  Review of Systems No chest pain, some SOB with sitting- has to take a deep breath, none with activity. Goes to the gym regularly, never has SOB or chest pain. No cough, no runny nose, no sore throat, no ear pain. No abdominal pain, no diarrhea, no constipation.     Objective:   Physical Exam  Constitutional: He is oriented to person, place, and time. He appears well-developed and well-nourished.  HENT:  Head: Normocephalic and atraumatic.  Right Ear: Tympanic membrane, external ear and ear canal normal.  Left Ear: Tympanic membrane, external ear and ear canal normal.  Nose: Nose normal.  Mouth/Throat:  Oropharynx is clear and moist.  Eyes: Conjunctivae are normal. Pupils are equal, round, and reactive to light.  Neck: Normal range of motion. Neck supple.  Cardiovascular: Normal rate, regular rhythm and normal heart sounds.   Pulmonary/Chest: Effort normal and breath sounds normal.  Musculoskeletal: Normal range of motion.  Lymphadenopathy:    He has no cervical adenopathy.  Neurological: He is alert and oriented to person, place, and time.  Skin: Skin is warm and dry.  Psychiatric: He has a normal mood and affect. His behavior is normal. Judgment and thought content normal.  Vitals reviewed.   BP 133/74 mmHg  Pulse 94  Temp(Src) 99.7 F (37.6 C)  Resp 16  Ht 5\' 10"  (1.778 m)  Wt 196 lb (88.905 kg)  BMI 28.12 kg/m2     Assessment & Plan:  1. BPV (benign positional vertigo), unspecified laterality - provided patient information regarding Epley maneuvers  2. Hypokalemia - last potassium level normal, no current symptoms of hypokalemia - instructed patient to eat healthy diet with adequate fruits and vegetable - RTC if he has any weakness, muscle cramps, palpitations  - discussed general healthy maintenance, importance of maintaining healthy weight, getting adequate sleep, managing stress  - follow up PRN   Olean Reeeborah Gessner, FNP-BC  [facility]  06/30/2015 6:40 AM

## 2015-06-27 NOTE — Patient Instructions (Signed)
Epley Maneuver Self-Care  WHAT IS THE EPLEY MANEUVER?  The Epley maneuver is an exercise you can do to relieve symptoms of benign paroxysmal positional vertigo (BPPV). This condition is often just referred to as vertigo. BPPV is caused by the movement of tiny crystals (canaliths) inside your inner ear. The accumulation and movement of canaliths in your inner ear causes a sudden spinning sensation (vertigo) when you move your head to certain positions. Vertigo usually lasts about 30 seconds. BPPV usually occurs in just one ear. If you get vertigo when you lie on your left side, you probably have BPPV in your left ear. Your health care provider can tell you which ear is involved.   BPPV may be caused by a head injury. Many people older than 50 get BPPV for unknown reasons. If you have been diagnosed with BPPV, your health care provider may teach you how to do this maneuver. BPPV is not life threatening (benign) and usually goes away in time.   WHEN SHOULD I PERFORM THE EPLEY MANEUVER?  You can do this maneuver at home whenever you have symptoms of vertigo. You may do the Epley maneuver up to 3 times a day until your symptoms of vertigo go away.  HOW SHOULD I DO THE EPLEY MANEUVER?  1. Sit on the edge of a bed or table with your back straight. Your legs should be extended or hanging over the edge of the bed or table.    2. Turn your head halfway toward the affected ear.    3. Lie backward quickly with your head turned until you are lying flat on your back. You may want to position a pillow under your shoulders.    4. Hold this position for 30 seconds. You may experience an attack of vertigo. This is normal. Hold this position until the vertigo stops.  5. Then turn your head to the opposite direction until your unaffected ear is facing the floor.    6. Hold this position for 30 seconds. You may experience an attack of vertigo. This is normal. Hold this position until the vertigo stops.  7. Now turn your whole body to  the same side as your head. Hold for another 30 seconds.    8. You can then sit back up.  ARE THERE RISKS TO THIS MANEUVER?  In some cases, you may have other symptoms (such as changes in your vision, weakness, or numbness). If you have these symptoms, stop doing the maneuver and call your health care provider. Even if doing these maneuvers relieves your vertigo, you may still have dizziness. Dizziness is the sensation of light-headedness but without the sensation of movement. Even though the Epley maneuver may relieve your vertigo, it is possible that your symptoms will return within 5 years.  WHAT SHOULD I DO AFTER THIS MANEUVER?  After doing the Epley maneuver, you can return to your normal activities. Ask your doctor if there is anything you should do at home to prevent vertigo. This may include:  · Sleeping with two or more pillows to keep your head elevated.  · Not sleeping on the side of your affected ear.  · Getting up slowly from bed.  · Avoiding sudden movements during the day.  · Avoiding extreme head movement, like looking up or bending over.  · Wearing a cervical collar to prevent sudden head movements.  WHAT SHOULD I DO IF MY SYMPTOMS GET WORSE?  Call your health care provider if your vertigo gets worse. Call your provider right way if   you have other symptoms, including:   · Nausea.  · Vomiting.  · Headache.  · Weakness.  · Numbness.  · Vision changes.     This information is not intended to replace advice given to you by your health care provider. Make sure you discuss any questions you have with your health care provider.     Document Released: 06/08/2013 Document Reviewed: 06/08/2013  Elsevier Interactive Patient Education ©2016 [employer]

## 2015-06-29 ENCOUNTER — Encounter (HOSPITAL_BASED_OUTPATIENT_CLINIC_OR_DEPARTMENT_OTHER): Payer: Self-pay | Admitting: *Deleted

## 2015-06-29 ENCOUNTER — Emergency Department (HOSPITAL_BASED_OUTPATIENT_CLINIC_OR_DEPARTMENT_OTHER): Payer: BLUE CROSS/BLUE SHIELD

## 2015-06-29 ENCOUNTER — Emergency Department (HOSPITAL_BASED_OUTPATIENT_CLINIC_OR_DEPARTMENT_OTHER)
Admission: EM | Admit: 2015-06-29 | Discharge: 2015-06-29 | Disposition: A | Discharge status: Home / Self Care | Payer: BLUE CROSS/BLUE SHIELD | Attending: Emergency Medicine | Admitting: Emergency Medicine

## 2015-06-29 DIAGNOSIS — R0602 Shortness of breath: Secondary | ICD-10-CM

## 2015-06-29 DIAGNOSIS — F411 Generalized anxiety disorder: Secondary | ICD-10-CM

## 2015-06-29 DIAGNOSIS — R0789 Other chest pain: Secondary | ICD-10-CM

## 2015-06-29 DIAGNOSIS — F419 Anxiety disorder, unspecified: Secondary | ICD-10-CM | POA: Insufficient documentation

## 2015-06-29 DIAGNOSIS — R110 Nausea: Secondary | ICD-10-CM | POA: Insufficient documentation

## 2015-06-29 LAB — CBC WITH DIFFERENTIAL/PLATELET
Basophils Absolute: 0 10*3/uL (ref 0.0–0.1)
Basophils Relative: 0 %
EOS ABS: 0.2 10*3/uL (ref 0.0–0.7)
Eosinophils Relative: 3 %
HCT: 45.6 % (ref 39.0–52.0)
Hemoglobin: 16.1 g/dL (ref 13.0–17.0)
Lymphocytes Relative: 31 %
Lymphs Abs: 2.8 10*3/uL (ref 0.7–4.0)
MCH: 29.9 pg (ref 26.0–34.0)
MCHC: 35.3 g/dL (ref 30.0–36.0)
MCV: 84.8 fL (ref 78.0–100.0)
MONO ABS: 0.5 10*3/uL (ref 0.1–1.0)
MONOS PCT: 6 %
Neutro Abs: 5.5 10*3/uL (ref 1.7–7.7)
Neutrophils Relative %: 60 %
PLATELETS: 267 10*3/uL (ref 150–400)
RBC: 5.38 MIL/uL (ref 4.22–5.81)
RDW: 13.1 % (ref 11.5–15.5)
WBC: 9 10*3/uL (ref 4.0–10.5)

## 2015-06-29 LAB — BASIC METABOLIC PANEL
Anion gap: 7 (ref 5–15)
BUN: 19 mg/dL (ref 6–20)
CHLORIDE: 102 mmol/L (ref 101–111)
CO2: 30 mmol/L (ref 22–32)
CREATININE: 0.88 mg/dL (ref 0.61–1.24)
Calcium: 9.5 mg/dL (ref 8.9–10.3)
GFR calc Af Amer: >60 mL/min (ref >60)
GFR calc non Af Amer: >60 mL/min (ref >60)
GLUCOSE: 111 mg/dL — AB (ref 65–99)
Potassium: 3.6 mmol/L (ref 3.5–5.1)
SODIUM: 139 mmol/L (ref 135–145)

## 2015-06-29 LAB — TROPONIN I

## 2015-06-29 MED ORDER — DIAZEPAM 5 MG/ML IJ SOLN
5.0000 mg | Freq: Once | INTRAMUSCULAR | Status: AC
  Administered 2015-06-29: 5 mg via INTRAVENOUS
  Filled 2015-06-29: qty 2

## 2015-06-29 MED ORDER — KETOROLAC TROMETHAMINE 30 MG/ML IJ SOLN
30.0000 mg | Freq: Once | INTRAMUSCULAR | Status: AC
  Administered 2015-06-29: 30 mg via INTRAVENOUS
  Filled 2015-06-29: qty 1

## 2015-06-29 MED ORDER — SODIUM CHLORIDE 0.9 % IV BOLUS (SEPSIS)
1000.0000 mL | Freq: Once | INTRAVENOUS | Status: AC
  Administered 2015-06-29: 1000 mL via INTRAVENOUS

## 2015-06-29 MED ORDER — DIAZEPAM 5 MG PO TABS: 5.0000 mg | ORAL_TABLET | Freq: Two times a day (BID) | ORAL | Status: DC | PRN

## 2015-06-29 NOTE — ED Provider Notes (Signed)
CSN: [id number]     Arrival date & time 06/29/15  1636 History   First MD Initiated Contact with Patient 06/29/15 1702     Chief Complaint  Patient presents with  . Shortness of Breath    HPI  Mr. Corey Freeman is an 25 y.o. male with history of vertigo who presents to the ED for evaluation of SOB. He states he was in his usual state of health until this afternoon when he was at home and suddenly felt short of breath and a heavy pressure in the middle of his chest. He states that he tried laying down, walking around but nothing helped. He states he then started panicking about not being able to breathe which made the chest pain and SOB worse. He reports that next his legs started to feel like jell-o. Denies LOC. He states nothing like this has ever happened before. Denies dizziness, headache, abdominal pain. He does endorse a "funny feeling" in his abdomen and being slightly nauseated but denies emesis. Denies diarrhea. He states now in the ED he feels a lot better but still has a slight feeling of pressure in his chest. He states that normally at baseline he does not have anxiety or depression but does feel more stressed and anxious recently because his parents left for a two week vacation and left him in charge of the house/family. Denies SI/HI.   History reviewed. No pertinent past medical history. History reviewed. No pertinent past surgical history. Family History  Problem Relation Age of Onset  . Diabetes Maternal Grandmother   . Hypertension Maternal Grandmother   . Diabetes Maternal Grandfather   . Diabetes Paternal Grandmother    Social History  Substance Use Topics  . Smoking status: Never Smoker   . Smokeless tobacco: Never Used  . Alcohol Use: 0.6 oz/week    1 Cans of beer per week     Comment: twice a month socially    Review of Systems  All other systems reviewed and are negative.     Allergies  Review of patient's allergies indicates no known allergies.  Home  Medications   Prior to Admission medications   Not on File   BP 128/89 mmHg  Pulse 92  Temp(Src) 99 F (37.2 C) (Oral)  Resp 18  Ht 5\' 10"  (1.778 m)  Wt 87.544 kg  BMI 27.69 kg/m2  SpO2 100% Physical Exam  Constitutional: He is oriented to person, place, and time. No distress.  HENT:  Right Ear: External ear normal.  Left Ear: External ear normal.  Nose: Nose normal.  Mouth/Throat: Oropharynx is clear and moist. No oropharyngeal exudate.  Eyes: Conjunctivae and EOM are normal. Pupils are equal, round, and reactive to light. Right eye exhibits no nystagmus. Left eye exhibits no nystagmus.  Neck: Normal range of motion. Neck supple.  Cardiovascular: Normal rate, regular rhythm, normal heart sounds and intact distal pulses.   Pulmonary/Chest: Effort normal and breath sounds normal. No respiratory distress. He has no wheezes. He has no rales.    Abdominal: Soft. Bowel sounds are normal. He exhibits no distension. There is no tenderness. There is no rebound and no guarding.  Musculoskeletal: He exhibits no edema.  Neurological: He is alert and oriented to person, place, and time. No cranial nerve deficit.  Skin: Skin is warm and dry. He is not diaphoretic.  Psychiatric: His mood appears anxious.  Nursing note and vitals reviewed.  Filed Vitals:   06/29/15 1638 06/29/15 1857  BP: 128/89 129/81  Pulse:  92 80  Temp: 99 F (37.2 C) 99.2 F (37.3 C)  TempSrc: Oral Oral  Resp: 18 16  Height: 5\' 10"  (1.778 m)   Weight: 87.544 kg   SpO2: 100% 99%     ED Course  Procedures (including critical care time) Labs Review Labs Reviewed  BASIC METABOLIC PANEL - Abnormal; Notable for the following:    Glucose, Bld 111 (*)    All other components within normal limits  CBC WITH DIFFERENTIAL/PLATELET  TROPONIN I    Imaging Review Dg Chest 2 View  06/29/2015  CLINICAL DATA:  Acute onset shortness of breath and chest pain today. Initial encounter. EXAM: CHEST  2 VIEW COMPARISON:  PA  and lateral chest 04/26/2013. FINDINGS: Lungs are clear. Heart size is normal. No pneumothorax or pleural effusion. No bony abnormality. IMPRESSION: Normal chest. Electronically Signed   By: Drusilla Kanner M.D.   On: 06/29/2015 17:48   I have personally reviewed and evaluated these images and lab results as part of my medical decision-making.   EKG Interpretation   Date/Time:  Thursday June 29 2015 17:48:48 EST Ventricular Rate:  83 PR Interval:  151 QRS Duration: 89 QT Interval:  364 QTC Calculation: 428 R Axis:   79 Text Interpretation:  Sinus rhythm Nonspecific ST abnormality Abnormal ekg  Confirmed by BEATON  MD, ROBERT ([id number]) on 06/29/2015 5:52:57 PM      MDM   Final diagnoses:  Anxiety state  Shortness of breath  Chest wall pain    I suspect pt had a mild anxiety/panic attack. Pt has reproducible chest pain. He is not working hard to breathe in the ED, is not tachypneic, and is not hypoxic. He does appear to be slightly anxious. Given chest pain and SOB will get basic labs, EKG, CXR to r/o acute cardiopulmonary etiology to pt's symptoms. However, I do not suspect ACS. PERC 0 and I do not suspect PE. Pt is afebrile, no URI symptoms, and I do not suspect pneumonia. In the meantime will give toradol for pain and valium.   Pt's labs, CXR, and EKG unremarkable. Pt reports improvement in his pain and anxiety with meds. Will give short course of Valium for home. Instructed to f/u with PCP. ER return precautions given.   Carlene Coria, PA-C 06/29/15 2105  Nelva Nay, MD 07/05/15 [phone]

## 2015-06-29 NOTE — Discharge Instructions (Signed)
You were seen in the emergency room today for evaluation of shortness of breath and chest pain. Your labs and chest x-ray were normal. Your symptoms were likely due to anxiety and stress. I will give you a short course of Valium to take as needed for anxiety. Please contact one of the clinics on the list provided to establish primary care for follow-up. Return to the ER for new or worsening symptoms.

## 2015-06-29 NOTE — ED Notes (Signed)
Patient ambulates to and from radiology department. 

## 2015-06-29 NOTE — ED Notes (Signed)
Pt verbalizes understanding of d/c instructions and denies any further needs at this time. 

## 2015-06-29 NOTE — ED Notes (Signed)
SOB. States he could not get his breath today while sitting. He has been seen here several times for dizziness and SOB. He was seen at a clinic yesterday and was told his diagnosis was vertigo.

## 2015-06-29 NOTE — ED Notes (Signed)
Pt describes midsternal CP onset 1 hr ago. No radiation. "Neck feels tense and legs feel like jelly". Some stress at home. Denies drug or ETOH use.

## 2015-10-18 ENCOUNTER — Ambulatory Visit (INDEPENDENT_AMBULATORY_CARE_PROVIDER_SITE_OTHER): Payer: BLUE CROSS/BLUE SHIELD | Admitting: Family Medicine

## 2015-10-18 VITALS — BP 130/78 | HR 94 | Temp 98.4°F | Resp 17 | Ht 71.0 in | Wt 197.0 lb

## 2015-10-18 DIAGNOSIS — G44219 Episodic tension-type headache, not intractable: Secondary | ICD-10-CM | POA: Diagnosis not present

## 2015-10-18 DIAGNOSIS — B349 Viral infection, unspecified: Secondary | ICD-10-CM | POA: Diagnosis not present

## 2015-10-18 LAB — POCT CBC
Granulocyte percent: 57.7 %G (ref 37–80)
HCT, POC: 43.9 % (ref 43.5–53.7)
Hemoglobin: 16 g/dL (ref 14.1–18.1)
Lymph, poc: 3.6 — AB (ref 0.6–3.4)
MCH, POC: 31.4 pg — AB (ref 27–31.2)
MCHC: 36.5 g/dL — AB (ref 31.8–35.4)
MCV: 86 fL (ref 80–97)
MID (cbc): 0.8 (ref 0–0.9)
MPV: 8.1 fL (ref 0–99.8)
POC Granulocyte: 5.9 (ref 2–6.9)
POC LYMPH PERCENT: 34.7 %L (ref 10–50)
POC MID %: 7.6 %M (ref 0–12)
Platelet Count, POC: 252 10*3/uL (ref 142–424)
RBC: 5.1 M/uL (ref 4.69–6.13)
RDW, POC: 12.9 %
WBC: 10.3 10*3/uL — AB (ref 4.6–10.2)

## 2015-10-18 MED ORDER — DICLOFENAC SODIUM 75 MG PO TBEC: 75.0000 mg | DELAYED_RELEASE_TABLET | Freq: Two times a day (BID) | ORAL | Status: DC

## 2015-10-18 MED ORDER — HYDROXYZINE HCL 25 MG PO TABS: 25.0000 mg | ORAL_TABLET | Freq: Three times a day (TID) | ORAL | Status: DC | PRN

## 2015-10-18 NOTE — Progress Notes (Signed)
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 He comes in with a feeling of dizziness, occipital headache, and paresthesias in his hands and feet.  He had a similar problem back in January when he was diagnosed with vertigo and given hydroxyzine and meclizine. These medicines seem to help him for a while. They're not working now.  He's never had hypertension or diabetes. He's a company by his mother. She's worried about his blood pressure.  Objective: Alert, cooperative, with stable gait HEENT including fundi: Normal Neck: Supple no adenopathy Chest: Clear Heart: Regular no murmur Skin: No rash  Results for orders placed or performed in visit on 10/18/15  POCT CBC  Result Value Ref Range   WBC 10.3 (A) 4.6 - 10.2 K/uL   Lymph, poc 3.6 (A) 0.6 - 3.4   POC LYMPH PERCENT 34.7 10 - 50 %L   MID (cbc) 0.8 0 - 0.9   POC MID % 7.6 0 - 12 %M   POC Granulocyte 5.9 2 - 6.9   Granulocyte percent 57.7 37 - 80 %G   RBC 5.10 4.69 - 6.13 M/uL   Hemoglobin 16.0 14.1 - 18.1 g/dL   HCT, POC 16.143.9 09.643.5 - 53.7 %   MCV 86.0 80 - 97 fL   MCH, POC 31.4 (A) 27 - 31.2 pg   MCHC 36.5 (A) 31.8 - 35.4 g/dL   RDW, POC 04.512.9 %   Platelet Count, POC 252 142 - 424 K/uL   MPV 8.1 0 - 99.8 fL   Assessment: I suspect patient has a combination of some anxiety and a viral illness. I know he doesn't feel well so will provide an anti-inflammatory and see how he does over the next week.  Plan: Voltaren 75 twice a day 7 days and return for recheck. Hydroxyzine 25 tid prn anxiety  Signed, Sheila OatsKurt Niranjan Rufener M.D.

## 2015-10-18 NOTE — Patient Instructions (Addendum)
  Please return in one week for recheck. I believe you have a viral illness and I provided a medicine that should help control the symptoms.

## 2015-10-19 LAB — THYROID PANEL WITH TSH
Free Thyroxine Index: 2.6 (ref 1.4–3.8)
T3 Uptake: 31 % (ref 22–35)
T4, Total: 8.3 ug/dL (ref 4.5–12.0)
TSH: 0.95 mIU/L (ref 0.40–4.50)

## 2015-10-30 ENCOUNTER — Ambulatory Visit (INDEPENDENT_AMBULATORY_CARE_PROVIDER_SITE_OTHER): Payer: BLUE CROSS/BLUE SHIELD | Admitting: Internal Medicine

## 2015-10-30 ENCOUNTER — Ambulatory Visit (INDEPENDENT_AMBULATORY_CARE_PROVIDER_SITE_OTHER): Payer: BLUE CROSS/BLUE SHIELD

## 2015-10-30 VITALS — BP 126/88 | HR 89 | Temp 98.7°F | Resp 16 | Ht 69.75 in | Wt 195.0 lb

## 2015-10-30 DIAGNOSIS — R0789 Other chest pain: Secondary | ICD-10-CM

## 2015-10-30 NOTE — Patient Instructions (Signed)
     IF you received an x-ray today, you will receive an invoice from [facility]. Please contact [facility] at [phone] with questions or concerns regarding your invoice.   IF you received labwork today, you will receive an invoice from [facility]. Please contact [facility] at [phone] with questions or concerns regarding your invoice.   Our billing staff will not be able to assist you with questions regarding bills from these companies.  You will be contacted with the lab results as soon as they are available. The fastest way to get your results is to activate your My Chart account. Instructions are located on the last page of this paperwork. If you have not heard from us regarding the results in 2 weeks, please contact this office.      

## 2015-10-30 NOTE — Progress Notes (Signed)
Subjective:  By signing my name below, I, Stann Ore, attest that this documentation has been prepared under the direction and in the presence of Ellamae Sia, MD. Electronically Signed: Stann Ore, Scribe. 10/30/2015 , 5:08 PM .  Patient was seen in Room 7 .   Patient ID: Corey Freeman, male    DOB: 1990/08/09, 25 y.o.   MRN: [id number] Chief Complaint  Patient presents with  . chest tightness    x 2 hours  . when lying on the left side    of chest pt feels tightness x 3 days ago,   . sharp pain in left upper arm, left side of chest   HPI Sumeet Geter is a 25 y.o. male who presents to [facility] complaining of chest tightness that worsened about 2 hours ago.  Patient states chest tightness worsening when he takes a deep breath or when he is lying on his left side. He denies any recent falls or injuries over his left side. He denies any cough recently.  Was sudden onset without palp or diaphor or SOB.  There are no active problems to display for this patient. but see recent OV DrL Started meds for anxiety.  Current outpatient prescriptions:  .  hydrOXYzine (ATARAX/VISTARIL) 25 MG tablet, Take 1 tablet (25 mg total) by mouth 3 (three) times daily as needed., Disp: 90 tablet, Rfl: 0 .  diclofenac (VOLTAREN) 75 MG EC tablet, Take 1 tablet (75 mg total) by mouth 2 (two) times daily. (Patient not taking: He stopped this because it made him nervous to take it  No Known Allergies   Review of Systems  Constitutional: Negative for fever, chills and fatigue.  Respiratory: Positive for chest tightness. Negative for cough, shortness of breath and wheezing.   Gastrointestinal: Negative for nausea, vomiting and diarrhea.  Musculoskeletal: Positive for myalgias.  Skin: Negative for wound.  Neurological: Negative for dizziness and headaches.       Objective:   Physical Exam  Constitutional: He is oriented to person, place, and time. He appears well-developed and well-nourished. No  distress.  HENT:  Head: Normocephalic and atraumatic.  Mouth/Throat: Oropharynx is clear and moist.  Eyes: Conjunctivae and EOM are normal. Pupils are equal, round, and reactive to light.  Neck: Neck supple. No thyromegaly present.  Cardiovascular: Normal rate, regular rhythm, normal heart sounds and intact distal pulses.   No murmur heard. Pulmonary/Chest: Effort normal and breath sounds normal. No respiratory distress. He has no wheezes. He has no rales.  adventitial sounds left lower lobe that cleared with deep breathing Tender cs junction 3,4,5 on L and ant ax line ribs lower L   Musculoskeletal: Normal range of motion.  Lymphadenopathy:    He has no cervical adenopathy.  Neurological: He is alert and oriented to person, place, and time.  Skin: Skin is warm and dry.  Psychiatric: He has a normal mood and affect. His behavior is normal.  Nursing note and vitals reviewed.   BP 126/88 mmHg  Pulse 89  Temp(Src) 98.7 F (37.1 C) (Oral)  Resp 16  Ht 5' 9.75" (1.772 m)  Wt 195 lb (88.451 kg)  BMI 28.17 kg/m2  SpO2 98%   [facility] reading (PRIMARY) by Dr. Merla Riches : chest xray: no pneumothorax, no effusion, no infiltrate, no rib fractures      Assessment & Plan:  I have completed the patient encounter in its entirety as documented by the scribe, with editing by me where necessary. Shanelle Clontz P. Merla Riches, M.D.  Chest tightness -  Plan: EKG 12-Lead, DG Chest 2 View  Chest wall pain -he acknowledges being in the weight room a lot 2 days ago without any known injury but doing a lot of upper body work. Will use heat/long warmup/low weights/ibupr if needed

## 2015-11-07 ENCOUNTER — Ambulatory Visit: Payer: BLUE CROSS/BLUE SHIELD | Admitting: Family Medicine

## 2015-11-14 ENCOUNTER — Other Ambulatory Visit: Payer: Self-pay | Admitting: Family Medicine

## 2016-01-01 ENCOUNTER — Ambulatory Visit: Payer: Self-pay | Admitting: Pediatrics

## 2016-03-12 ENCOUNTER — Ambulatory Visit (INDEPENDENT_AMBULATORY_CARE_PROVIDER_SITE_OTHER): Payer: BLUE CROSS/BLUE SHIELD | Admitting: Family Medicine

## 2016-03-12 VITALS — BP 128/80 | HR 89 | Temp 98.3°F | Resp 16 | Ht 71.5 in | Wt 181.4 lb

## 2016-03-12 DIAGNOSIS — Z23 Encounter for immunization: Secondary | ICD-10-CM | POA: Diagnosis not present

## 2016-03-12 DIAGNOSIS — Z0000 Encounter for general adult medical examination without abnormal findings: Secondary | ICD-10-CM

## 2016-03-12 DIAGNOSIS — R42 Dizziness and giddiness: Secondary | ICD-10-CM | POA: Diagnosis not present

## 2016-03-12 DIAGNOSIS — Z114 Encounter for screening for human immunodeficiency virus [HIV]: Secondary | ICD-10-CM | POA: Diagnosis not present

## 2016-03-12 DIAGNOSIS — Z131 Encounter for screening for diabetes mellitus: Secondary | ICD-10-CM | POA: Diagnosis not present

## 2016-03-12 DIAGNOSIS — R634 Abnormal weight loss: Secondary | ICD-10-CM | POA: Diagnosis not present

## 2016-03-12 DIAGNOSIS — R631 Polydipsia: Secondary | ICD-10-CM

## 2016-03-12 DIAGNOSIS — R110 Nausea: Secondary | ICD-10-CM | POA: Diagnosis not present

## 2016-03-12 LAB — COMPLETE METABOLIC PANEL WITH GFR
ALBUMIN: 4.7 g/dL (ref 3.6–5.1)
ALK PHOS: 52 U/L (ref 40–115)
ALT: 22 U/L (ref 9–46)
AST: 16 U/L (ref 10–40)
BUN: 13 mg/dL (ref 7–25)
CHLORIDE: 102 mmol/L (ref 98–110)
CO2: 24 mmol/L (ref 20–31)
CREATININE: 0.96 mg/dL (ref 0.60–1.35)
Calcium: 9.7 mg/dL (ref 8.6–10.3)
GFR, Est African American: >89 mL/min (ref >=60)
GFR, Est Non African American: >89 mL/min (ref >=60)
GLUCOSE: 91 mg/dL (ref 65–99)
POTASSIUM: 3.7 mmol/L (ref 3.5–5.3)
SODIUM: 138 mmol/L (ref 135–146)
Total Bilirubin: 0.6 mg/dL (ref 0.2–1.2)
Total Protein: 7.4 g/dL (ref 6.1–8.1)

## 2016-03-12 LAB — POCT URINALYSIS DIP (MANUAL ENTRY)
Bilirubin, UA: NEGATIVE
GLUCOSE UA: NEGATIVE
Ketones, POC UA: NEGATIVE
Leukocytes, UA: NEGATIVE
NITRITE UA: NEGATIVE
Spec Grav, UA: >=1.03
UROBILINOGEN UA: 0.2
pH, UA: 5.5

## 2016-03-12 LAB — POCT CBC
GRANULOCYTE PERCENT: 57.7 % (ref 37–80)
HEMATOCRIT: 46.1 % (ref 43.5–53.7)
Hemoglobin: 16.6 g/dL (ref 14.1–18.1)
Lymph, poc: 2.8 (ref 0.6–3.4)
MCH, POC: 31 pg (ref 27–31.2)
MCHC: 36 g/dL — AB (ref 31.8–35.4)
MCV: 86.2 fL (ref 80–97)
MID (CBC): 0.3 (ref 0–0.9)
MPV: 8.3 fL (ref 0–99.8)
POC GRANULOCYTE: 4.3 (ref 2–6.9)
POC LYMPH %: 37.6 % (ref 10–50)
POC MID %: 4.7 % (ref 0–12)
Platelet Count, POC: 234 10*3/uL (ref 142–424)
RBC: 5.36 M/uL (ref 4.69–6.13)
RDW, POC: 12.6 %
WBC: 7.4 10*3/uL (ref 4.6–10.2)

## 2016-03-12 LAB — HIV ANTIBODY (ROUTINE TESTING W REFLEX): HIV: NONREACTIVE

## 2016-03-12 LAB — TSH: TSH: 0.74 m[IU]/L (ref 0.40–4.50)

## 2016-03-12 LAB — GLUCOSE, POCT (MANUAL RESULT ENTRY): POC Glucose: 86 mg/dl (ref 70–99)

## 2016-03-12 LAB — POCT GLYCOSYLATED HEMOGLOBIN (HGB A1C): Hemoglobin A1C: 5.3

## 2016-03-12 NOTE — Patient Instructions (Addendum)
Flu vaccine given today.  I recommend HPV vaccine.  Return if you would like to have that given.   Blood sugar and blood counts were normal in the office today. Urine tests overall looked okay, except possible trace blood. I will check thyroid test, kidney and liver tests, and other electrolytes, but return to discuss those results and your symptoms further in the next 2 weeks. We can recheck your urine test at that time to make sure the possible blood has disappeared.  See information below on dizziness, but for now make sure you're drinking plenty of fluids throughout the day, at least 3 meals per day and snacks in between if needed, and 8 hours of sleep per night.  Return to the clinic or go to the nearest emergency room if any of your symptoms worsen or new symptoms occur.  Keeping you healthy  Get these tests  Blood pressure- Have your blood pressure checked once a year by your healthcare provider.  Normal blood pressure is 120/80.  Weight- Have your body mass index (BMI) calculated to screen for obesity.  BMI is a measure of body fat based on height and weight. You can also calculate your own BMI at [email or web address].west-esparza.[email or web address]  Cholesterol- Have your cholesterol checked regularly starting at age 51, sooner may be necessary if you have diabetes, high blood pressure, if a family member developed heart diseases at an early age or if you smoke.   Chlamydia, HIV, and other sexual transmitted disease- Get screened each year until the age of 47 then within three months of each new sexual partner.  Diabetes- Have your blood sugar checked regularly if you have high blood pressure, high cholesterol, a family history of diabetes or if you are overweight.  Get these vaccines  Flu shot- Every fall.  Tetanus shot- Every 10 years.  Menactra- Single dose; prevents meningitis.  Take these steps  Don't smoke- If you do smoke, ask your healthcare provider about quitting. For tips on how to quit,  go to www.smokefree.gov or call 1-800-QUIT-NOW.  Be physically active- Exercise 5 days a week for at least 30 minutes.  If you are not already physically active start slow and gradually work up to 30 minutes of moderate physical activity.  Examples of moderate activity include walking briskly, mowing the yard, dancing, swimming bicycling, etc.  Eat a healthy diet- Eat a variety of healthy foods such as fruits, vegetables, low fat milk, low fat cheese, yogurt, lean meats, poultry, fish, beans, tofu, etc.  For more information on healthy eating, go to www.thenutritionsource.org  Drink alcohol in moderation- Limit alcohol intake two drinks or less a day.  Never drink and drive.  Dentist- Brush and floss teeth twice daily; visit your dentis twice a year.  Depression-Your emotional health is as important as your physical health.  If you're feeling down, losing interest in things you normally enjoy please talk with your healthcare provider.  Gun Safety- If you keep a gun in your home, keep it unloaded and with the safety lock on.  Bullets should be stored separately.  Helmet use- Always wear a helmet when riding a motorcycle, bicycle, rollerblading or skateboarding.  Safe sex- If you may be exposed to a sexually transmitted infection, use a condom  Seat belts- Seat bels can save your life; always wear one.  Smoke/Carbon Monoxide detectors- These detectors need to be installed on the appropriate level of your home.  Replace batteries at least once a year.  Skin Cancer- When out  in the sun, cover up and use sunscreen SPF 15 or higher.  Violence- If anyone is threatening or hurting you, please tell your healthcare provider. Fatigue Fatigue is feeling tired all of the time, a lack of energy, or a lack of motivation. Occasional or mild fatigue is often a normal response to activity or life in general. However, long-lasting (chronic) or extreme fatigue may indicate an underlying medical  condition. HOME CARE INSTRUCTIONS  Watch your fatigue for any changes. The following actions may help to lessen any discomfort you are feeling:  Talk to your health care provider about how much sleep you need each night. Try to get the required amount every night.  Take medicines only as directed by your health care provider.  Eat a healthy and nutritious diet. Ask your health care provider if you need help changing your diet.  Drink enough fluid to keep your urine clear or pale yellow.  Practice ways of relaxing, such as yoga, meditation, massage therapy, or acupuncture.  Exercise regularly.   Change situations that cause you stress. Try to keep your work and personal routine reasonable.  Do not abuse illegal drugs.  Limit alcohol intake to no more than 1 drink per day for nonpregnant women and 2 drinks per day for men. One drink equals 12 ounces of beer, 5 ounces of wine, or 1 ounces of hard liquor.  Take a multivitamin, if directed by your health care provider. SEEK MEDICAL CARE IF:   Your fatigue does not get better.  You have a fever.   You have unintentional weight loss or gain.  You have headaches.   You have difficulty:   Falling asleep.  Sleeping throughout the night.  You feel angry, guilty, anxious, or sad.   You are unable to have a bowel movement (constipation).   You skin is dry.   Your legs or another part of your body is swollen.  SEEK IMMEDIATE MEDICAL CARE IF:   You feel confused.   Your vision is blurry.  You feel faint or pass out.   You have a severe headache.   You have severe abdominal, pelvic, or back pain.   You have chest pain, shortness of breath, or an irregular or fast heartbeat.   You are unable to urinate or you urinate less than normal.   You develop abnormal bleeding, such as bleeding from the rectum, vagina, nose, lungs, or nipples.  You vomit blood.   You have thoughts about harming yourself or  committing suicide.   You are worried that you might harm someone else.    This information is not intended to replace advice given to you by your health care provider. Make sure you discuss any questions you have with your health care provider.   Document Released: 03/31/2007 Document Revised: 06/24/2014 Document Reviewed: 10/05/2013 Elsevier Interactive Patient Education 2016 Elsevier Inc.   Dizziness Dizziness is a common problem. It is a feeling of unsteadiness or light-headedness. You may feel like you are about to faint. Dizziness can lead to injury if you stumble or fall. Anyone can become dizzy, but dizziness is more common in older adults. This condition can be caused by a number of things, including medicines, dehydration, or illness. HOME CARE INSTRUCTIONS Taking these steps may help with your condition: Eating and Drinking  Drink enough fluid to keep your urine clear or pale yellow. This helps to keep you from becoming dehydrated. Try to drink more clear fluids, such as water.  Do not  drink alcohol.  Limit your caffeine intake if directed by your health care provider.  Limit your salt intake if directed by your health care provider. Activity  Avoid making quick movements.  Rise slowly from chairs and steady yourself until you feel okay.  In the morning, first sit up on the side of the bed. When you feel okay, stand slowly while you hold onto something until you know that your balance is fine.  Move your legs often if you need to stand in one place for a long time. Tighten and relax your muscles in your legs while you are standing.  Do not drive or operate heavy machinery if you feel dizzy.  Avoid bending down if you feel dizzy. Place items in your home so that they are easy for you to reach without leaning over. Lifestyle  Do not use any tobacco products, including cigarettes, chewing tobacco, or electronic cigarettes. If you need help quitting, ask your health  care provider.  Try to reduce your stress level, such as with yoga or meditation. Talk with your health care provider if you need help. General Instructions  Watch your dizziness for any changes.  Take medicines only as directed by your health care provider. Talk with your health care provider if you think that your dizziness is caused by a medicine that you are taking.  Tell a friend or a family member that you are feeling dizzy. If he or she notices any changes in your behavior, have this person call your health care provider.  Keep all follow-up visits as directed by your health care provider. This is important. SEEK MEDICAL CARE IF:  Your dizziness does not go away.  Your dizziness or light-headedness gets worse.  You feel nauseous.  You have reduced hearing.  You have new symptoms.  You are unsteady on your feet or you feel like the room is spinning. SEEK IMMEDIATE MEDICAL CARE IF:  You vomit or have diarrhea and are unable to eat or drink anything.  You have problems talking, walking, swallowing, or using your arms, hands, or legs.  You feel generally weak.  You are not thinking clearly or you have trouble forming sentences. It may take a friend or family member to notice this.  You have chest pain, abdominal pain, shortness of breath, or sweating.  Your vision changes.  You notice any bleeding.  You have a headache.  You have neck pain or a stiff neck.  You have a fever.   This information is not intended to replace advice given to you by your health care provider. Make sure you discuss any questions you have with your health care provider.   Document Released: 11/27/2000 Document Revised: 10/18/2014 Document Reviewed: 05/30/2014 Elsevier Interactive Patient Education 2016 [employer].   HPV (Human Papillomavirus) Vaccine--Gardasil-9:  1. Why get vaccinated? Gardasil-9 prevents human papillomavirus (HPV) types that cause many cancers,  including:  cervical cancer in females,  vaginal and vulvar cancers in females,  anal cancer in females and males,  throat cancer in females and males, and  penile cancer in males. In addition, Gardasil-9 prevents HPV types that cause genital warts in both females and males. In the [place] about 12,000 women get cervical cancer every year, and about 4,000 women die from it. Corey Freeman can prevent most of these cases of cervical cancer. Vaccination is not a substitute for cervical cancer screening. This vaccine does not protect against all HPV types that can cause cervical cancer. Women should still get  regular Pap tests. HPV infection usually comes from sexual contact, and most people will become infected at some point in their life. About 14 million Americans, including teens, get infected every year. Most infections will go away and not cause serious problems. But thousands of women and men get cancer and diseases from HPV. 2. HPV vaccine Corey ChiquitoGardasil-9 is an FDA-approved HPV vaccine. It is recommended for both males and females. It is routinely given at 8111 or 25 years of age, but it may be given beginning at age 789 years through age 10226 years. Three doses of Gardasil-9 are recommended with the second dose given 1-2 months after the first dose and the third dose given 6 months after the first dose. 3. Some people should not get this vaccine  Anyone who has had a severe, life-threatening allergic reaction to a dose of HPV vaccine should not get another dose.  Anyone who has a severe (life threatening) allergy to any component of HPV vaccine should not get the vaccine. Tell your doctor if you have any severe allergies that you know of, including a severe allergy to yeast.  HPV vaccine is not recommended for pregnant women. If you learn that you were pregnant when you were vaccinated, there is no reason to expect any problems for you or your baby. Any woman who learns she was pregnant when she got  Gardasil-9 vaccine is encouraged to contact the manufacturer's registry for HPV vaccination during pregnancy at [phone]. Women who are breastfeeding may be vaccinated.  If you have a mild illness, such as a cold, you can probably get the vaccine today. If you are moderately or severely ill, you should probably wait until you recover. Your doctor can advise you. 4. Risks of a vaccine reaction With any medicine, including vaccines, there is a chance of side effects. These are usually mild and go away on their own, but serious reactions are also possible. Most people who get HPV vaccine do not have any serious problems with it. Mild or moderate problems following Gardasil-9:  Reactions in the arm where the shot was given:  Soreness (about 9 people in 10)  Redness or swelling (about 1 person in 3)  Fever:  Mild (100F) (about 1 person in 10)  Moderate (102F) (about 1 person in 65)  Other problems:  Headache (about 1 person in 3) Problems that could happen after any injected vaccine:  People sometimes faint after a medical procedure, including vaccination. Sitting or lying down for about 15 minutes can help prevent fainting, and injuries caused by a fall. Tell your doctor if you feel dizzy, or have vision changes or ringing in the ears.  Some people get severe pain in the shoulder and have difficulty moving the arm where a shot was given. This happens very rarely.  Any medication can cause a severe allergic reaction. Such reactions from a vaccine are very rare, estimated at about 1 in a million doses, and would happen within a few minutes to a few hours after the vaccination. As with any medicine, there is a very remote chance of a vaccine causing a serious injury or death. The safety of vaccines is always being monitored. For more information, visit: http://floyd.org/www.cdc.gov/vaccinesafety/. 5. What if there is a serious reaction? What should I look for? Look for anything that concerns you,  such as signs of a severe allergic reaction, very high fever, or unusual behavior. Signs of a severe allergic reaction can include hives, swelling of the face and throat,  difficulty breathing, a fast heartbeat, dizziness, and weakness. These would usually start a few minutes to a few hours after the vaccination. What should I do? If you think it is a severe allergic reaction or other emergency that can't wait, call 9-1-1 or get to the nearest hospital. Otherwise, call your doctor. Afterward, the reaction should be reported to the "Vaccine Adverse Event Reporting System" (VAERS). Your doctor might file this report, or you can do it yourself through the VAERS web site at www.vaers.LAgents.no, or by calling 1-(863)572-5443. VAERS does not give medical advice. 6. The National Vaccine Injury Compensation Program The [employer] Vaccine Injury Compensation Program (VICP) is a federal program that was created to compensate people who may have been injured by certain vaccines. Persons who believe they may have been injured by a vaccine can learn about the program and about filing a claim by calling 1-404-447-8647 or visiting the VICP website at SpiritualWord.at. There is a time limit to file a claim for compensation. 7. How can I learn more?  Ask your health care provider. He or she can give you the vaccine package insert or suggest other sources of information.  Call your local or state health department.  Contact the Centers for Disease Control and Prevention (CDC):  Call (934) 263-4054 (1-800-CDC-INFO) or  Visit CDC's website at RunningConvention.de Vaccine Information Statement HPV Vaccine Corey Freeman) 09/15/14   This information is not intended to replace advice given to you by your health care provider. Make sure you discuss any questions you have with your health care provider.   Document Released: 12/29/2013 Document Revised: 10/18/2014 Document Reviewed: 12/29/2013 Elsevier Interactive  Patient Education 2016 [employer].   IF you received an x-ray today, you will receive an invoice from [facility]. Please contact [facility] at [phone] with questions or concerns regarding your invoice.   IF you received labwork today, you will receive an invoice from [employer]. Please contact [facility] at [phone] with questions or concerns regarding your invoice.   Our billing staff will not be able to assist you with questions regarding bills from these companies.  You will be contacted with the lab results as soon as they are available. The fastest way to get your results is to activate your My Chart account. Instructions are located on the last page of this paperwork. If you have not heard from [place] regarding the results in 2 weeks, please contact this office.

## 2016-03-12 NOTE — Progress Notes (Signed)
Subjective:  By signing my name below, I, Stann Ore, attest that this documentation has been prepared under the direction and in the presence of Meredith Staggers, MD. Electronically Signed: Stann Ore, Scribe. 03/12/2016 , 9:18 AM .  Patient was seen in Room 14 .   Patient ID: Corey Freeman, male    DOB: 04-Apr-1991, 25 y.o.   MRN: [id number] Chief Complaint  Patient presents with  . Annual Exam   HPI Corey Freeman is a 25 y.o. male Here for annual physical. See earlier this year for tension headache, and then chest wall pain in May. He is fasting today.   Dizziness + fatigue He also requested A1c check because he's been having dizziness, fatigue, increased thirst and unexpected weight loss. He's noticed the dizziness and fatigue for the past week. The weight loss was noticed about a month ago, without any major changes in his diet. Both of his parents have type-2 diabetes. He feels nauseated after smelling "something weird". He denies increased urinary frequency, or fever. He denies checking his blood sugar. He mentions occasional weakness in his left upper thigh and left upper arm.   Wt Readings from Last 3 Encounters:  03/12/16 181 lb 6.4 oz (82.3 kg)  10/30/15 195 lb (88.5 kg)  10/18/15 197 lb (89.4 kg)   Cancer Screening He denies any known family history of cancer.   Immunizations Immunization History  Administered Date(s) Administered  . Tdap 06/17/2013   He agrees to flu shot today.  He was given information on gardasil.   STD screening He is sexually active with females only. He denies history of STDs.  He's been with the same partner for 6 years now. He denies history of HIV screening.   Depression Depression screen [facility] 2/9 03/12/2016 10/30/2015 10/18/2015 06/27/2015 01/02/2015  Decreased Interest 0 0 1 0 0  Down, Depressed, Hopeless 0 0 2 0 0  PHQ - 2 Score 0 0 3 0 0  Altered sleeping - 0 0 - -  Tired, decreased energy - 0 0 - -  Change in appetite - 0 1 - -    Feeling bad or failure about yourself  - 0 0 - -  Trouble concentrating - 0 0 - -  Moving slowly or fidgety/restless - 0 0 - -  Suicidal thoughts - 0 0 - -  PHQ-9 Score - 0 4 - -    Vision  Visual Acuity Screening   Right eye Left eye Both eyes  Without correction: 20/15 20/15 20/13   With correction:       Dentist He denies having a dentist. He was recommended to be seen every 6 months.   Exercise He exercises about 2-3 days a week.   Medications He's still taking hydroxyzine prn. He denies taking any OTC medications.   Social He attends [employer] for [employer], is a Holiday representative.  He works at [employer] in [employer].   There are no active problems to display for this patient.  No past medical history on file. No past surgical history on file. No Known Allergies Prior to Admission medications   Medication Sig Start Date End Date Taking? Authorizing Provider  hydrOXYzine (ATARAX/VISTARIL) 25 MG tablet TAKE 1 TABLET (25 MG TOTAL) BY MOUTH 3 (THREE) TIMES DAILY AS NEEDED. 11/15/15  Yes Chelle Jeffery, PA-C  diclofenac (VOLTAREN) 75 MG EC tablet Take 1 tablet (75 mg total) by mouth 2 (two) times daily. Patient not taking: Reported on 03/12/2016 10/18/15   Kenyon Ana  Lauenstein, MD   Social History   Social History  . Marital status: Single    Spouse name: N/A  . Number of children: N/A  . Years of education: N/A   Occupational History  . Not on file.   Social History Main Topics  . Smoking status: Never Smoker  . Smokeless tobacco: Never Used  . Alcohol use 0.6 oz/week    1 Cans of beer per week     Comment: twice a month socially  . Drug use: No  . Sexual activity: Not on file   Other Topics Concern  . Not on file   Social History Narrative  . No narrative on file   Review of Systems  Constitutional: Negative for fever.  Genitourinary: Negative for frequency.   13 point ROS - positive for dizziness, fatigue, increased thirst and unexpected weight loss     Objective:   Physical Exam  Constitutional: He is oriented to person, place, and time. He appears well-developed and well-nourished.  HENT:  Head: Normocephalic and atraumatic.  Right Ear: External ear normal.  Left Ear: External ear normal.  Mouth/Throat: Oropharynx is clear and moist.  Eyes: Conjunctivae and EOM are normal. Pupils are equal, round, and reactive to light.  Neck: Normal range of motion. Neck supple. No thyromegaly (no apparent nodules) present.  Cardiovascular: Normal rate, regular rhythm, normal heart sounds and intact distal pulses.  Exam reveals no gallop and no friction rub.   No murmur heard. Pulmonary/Chest: Effort normal and breath sounds normal. No respiratory distress. He has no wheezes.  Abdominal: Soft. Bowel sounds are normal. He exhibits no distension. There is no tenderness.  Musculoskeletal: Normal range of motion. He exhibits no edema or tenderness.  Lymphadenopathy:    He has no cervical adenopathy.  Neurological: He is alert and oriented to person, place, and time. He has normal reflexes.  No focal weakness  Skin: Skin is warm and dry.  Psychiatric: He has a normal mood and affect. His behavior is normal.  Vitals reviewed.   Vitals:   03/12/16 0849  BP: 128/80  Pulse: 89  Resp: 16  Temp: 98.3 F (36.8 C)  TempSrc: Oral  SpO2: 98%  Weight: 181 lb 6.4 oz (82.3 kg)  Height: 5' 11.5" (1.816 m)   Results for orders placed or performed in visit on 03/12/16  POCT glucose (manual entry)  Result Value Ref Range   POC Glucose 86 70 - 99 mg/dl  POCT glycosylated hemoglobin (Hb A1C)  Result Value Ref Range   Hemoglobin A1C 5.3   POCT urinalysis dipstick  Result Value Ref Range   Color, UA yellow yellow   Clarity, UA clear clear   Glucose, UA negative negative   Bilirubin, UA negative negative   Ketones, POC UA negative negative   Spec Grav, UA >=1.030    Blood, UA trace-lysed (A) negative   pH, UA 5.5    Protein Ur, POC trace (A) negative    Urobilinogen, UA 0.2    Nitrite, UA Negative Negative   Leukocytes, UA Negative Negative  POCT CBC  Result Value Ref Range   WBC 7.4 4.6 - 10.2 K/uL   Lymph, poc 2.8 0.6 - 3.4   POC LYMPH PERCENT 37.6 10 - 50 %L   MID (cbc) 0.3 0 - 0.9   POC MID % 4.7 0 - 12 %M   POC Granulocyte 4.3 2 - 6.9   Granulocyte percent 57.7 37 - 80 %G   RBC 5.36 4.69 -  6.13 M/uL   Hemoglobin 16.6 14.1 - 18.1 g/dL   HCT, POC 78.2 95.6 - 53.7 %   MCV 86.2 80 - 97 fL   MCH, POC 31.0 27 - 31.2 pg   MCHC 36.0 (A) 31.8 - 35.4 g/dL   RDW, POC 21.3 %   Platelet Count, POC 234 142 - 424 K/uL   MPV 8.3 0 - 99.8 fL       Assessment & Plan:    Corey Freeman is a 25 y.o. male Annual physical exam  - -anticipatory guidance as below in AVS, screening labs above. Health maintenance items as above in HPI discussed/recommended as applicable.   Screening for HIV (human immunodeficiency virus) - Plan: HIV antibody  Abnormal weight loss - Plan: POCT glucose (manual entry), POCT glycosylated hemoglobin (Hb A1C), POCT urinalysis dipstick Increased thirst - Plan: POCT glucose (manual entry), POCT glycosylated hemoglobin (Hb A1C), POCT urinalysis dipstick Nausea without vomiting - Plan: COMPLETE METABOLIC PANEL WITH GFR Dizziness - Plan: TSH, POCT CBC, EKG 12-Lead  - Reassuring urinalysis,  Glucose, EKG, as well as CBC. Will check TSH, CMP, discuss other possible causes at follow-up within the next 2 weeks, sooner if any worsening symptoms. In the meantime, recommended increased fluid intake, meals at least 3 times per day with snacks in between as needed, and at least 8-9 hours of sleep at night. RTC precautions if any acute or worsening symptoms.   Screening for diabetes mellitus - Plan: POCT glucose (manual entry), POCT glycosylated hemoglobin (Hb A1C), POCT urinalysis dipstick  - As above, reassuring testing in office.  Needs flu shot - Plan: Flu Vaccine QUAD 36+ mos IM  -Flu vaccine given.  No orders of the  defined types were placed in this encounter.  Patient Instructions    Flu vaccine given today.  I recommend HPV vaccine.  Return if you would like to have that given.   Blood sugar and blood counts were normal in the office today. Urine tests overall looked okay, except possible trace blood. I will check thyroid test, kidney and liver tests, and other electrolytes, but return to discuss those results and your symptoms further in the next 2 weeks. We can recheck your urine test at that time to make sure the possible blood has disappeared.  See information below on dizziness, but for now make sure you're drinking plenty of fluids throughout the day, at least 3 meals per day and snacks in between if needed, and 8 hours of sleep per night.  Return to the clinic or go to the nearest emergency room if any of your symptoms worsen or new symptoms occur.  Keeping you healthy  Get these tests  Blood pressure- Have your blood pressure checked once a year by your healthcare provider.  Normal blood pressure is 120/80.  Weight- Have your body mass index (BMI) calculated to screen for obesity.  BMI is a measure of body fat based on height and weight. You can also calculate your own BMI at https://www.west-esparza.com/.  Cholesterol- Have your cholesterol checked regularly starting at age 74, sooner may be necessary if you have diabetes, high blood pressure, if a family member developed heart diseases at an early age or if you smoke.   Chlamydia, HIV, and other sexual transmitted disease- Get screened each year until the age of 45 then within three months of each new sexual partner.  Diabetes- Have your blood sugar checked regularly if you have high blood pressure, high cholesterol, a family history of  diabetes or if you are overweight.  Get these vaccines  Flu shot- Every fall.  Tetanus shot- Every 10 years.  Menactra- Single dose; prevents meningitis.  Take these steps  Don't smoke- If you do  smoke, ask your healthcare provider about quitting. For tips on how to quit, go to www.smokefree.gov or call 1-800-QUIT-NOW.  Be physically active- Exercise 5 days a week for at least 30 minutes.  If you are not already physically active start slow and gradually work up to 30 minutes of moderate physical activity.  Examples of moderate activity include walking briskly, mowing the yard, dancing, swimming bicycling, etc.  Eat a healthy diet- Eat a variety of healthy foods such as fruits, vegetables, low fat milk, low fat cheese, yogurt, lean meats, poultry, fish, beans, tofu, etc.  For more information on healthy eating, go to www.thenutritionsource.org  Drink alcohol in moderation- Limit alcohol intake two drinks or less a day.  Never drink and drive.  Dentist- Brush and floss teeth twice daily; visit your dentis twice a year.  Depression-Your emotional health is as important as your physical health.  If you're feeling down, losing interest in things you normally enjoy please talk with your healthcare provider.  Gun Safety- If you keep a gun in your home, keep it unloaded and with the safety lock on.  Bullets should be stored separately.  Helmet use- Always wear a helmet when riding a motorcycle, bicycle, rollerblading or skateboarding.  Safe sex- If you may be exposed to a sexually transmitted infection, use a condom  Seat belts- Seat bels can save your life; always wear one.  Smoke/Carbon Monoxide detectors- These detectors need to be installed on the appropriate level of your home.  Replace batteries at least once a year.  Skin Cancer- When out in the sun, cover up and use sunscreen SPF 15 or higher.  Violence- If anyone is threatening or hurting you, please tell your healthcare provider. Fatigue Fatigue is feeling tired all of the time, a lack of energy, or a lack of motivation. Occasional or mild fatigue is often a normal response to activity or life in general. However, long-lasting  (chronic) or extreme fatigue may indicate an underlying medical condition. HOME CARE INSTRUCTIONS  Watch your fatigue for any changes. The following actions may help to lessen any discomfort you are feeling:  Talk to your health care provider about how much sleep you need each night. Try to get the required amount every night.  Take medicines only as directed by your health care provider.  Eat a healthy and nutritious diet. Ask your health care provider if you need help changing your diet.  Drink enough fluid to keep your urine clear or pale yellow.  Practice ways of relaxing, such as yoga, meditation, massage therapy, or acupuncture.  Exercise regularly.   Change situations that cause you stress. Try to keep your work and personal routine reasonable.  Do not abuse illegal drugs.  Limit alcohol intake to no more than 1 drink per day for nonpregnant women and 2 drinks per day for men. One drink equals 12 ounces of beer, 5 ounces of wine, or 1 ounces of hard liquor.  Take a multivitamin, if directed by your health care provider. SEEK MEDICAL CARE IF:   Your fatigue does not get better.  You have a fever.   You have unintentional weight loss or gain.  You have headaches.   You have difficulty:   Falling asleep.  Sleeping throughout the night.  You feel angry, guilty, anxious, or sad.   You are unable to have a bowel movement (constipation).   You skin is dry.   Your legs or another part of your body is swollen.  SEEK IMMEDIATE MEDICAL CARE IF:   You feel confused.   Your vision is blurry.  You feel faint or pass out.   You have a severe headache.   You have severe abdominal, pelvic, or back pain.   You have chest pain, shortness of breath, or an irregular or fast heartbeat.   You are unable to urinate or you urinate less than normal.   You develop abnormal bleeding, such as bleeding from the rectum, vagina, nose, lungs, or nipples.  You  vomit blood.   You have thoughts about harming yourself or committing suicide.   You are worried that you might harm someone else.    This information is not intended to replace advice given to you by your health care provider. Make sure you discuss any questions you have with your health care provider.   Document Released: 03/31/2007 Document Revised: 06/24/2014 Document Reviewed: 10/05/2013 Elsevier Interactive Patient Education 2016 Elsevier Inc.   Dizziness Dizziness is a common problem. It is a feeling of unsteadiness or light-headedness. You may feel like you are about to faint. Dizziness can lead to injury if you stumble or fall. Anyone can become dizzy, but dizziness is more common in older adults. This condition can be caused by a number of things, including medicines, dehydration, or illness. HOME CARE INSTRUCTIONS Taking these steps may help with your condition: Eating and Drinking  Drink enough fluid to keep your urine clear or pale yellow. This helps to keep you from becoming dehydrated. Try to drink more clear fluids, such as water.  Do not drink alcohol.  Limit your caffeine intake if directed by your health care provider.  Limit your salt intake if directed by your health care provider. Activity  Avoid making quick movements.  Rise slowly from chairs and steady yourself until you feel okay.  In the morning, first sit up on the side of the bed. When you feel okay, stand slowly while you hold onto something until you know that your balance is fine.  Move your legs often if you need to stand in one place for a long time. Tighten and relax your muscles in your legs while you are standing.  Do not drive or operate heavy machinery if you feel dizzy.  Avoid bending down if you feel dizzy. Place items in your home so that they are easy for you to reach without leaning over. Lifestyle  Do not use any tobacco products, including cigarettes, chewing tobacco, or  electronic cigarettes. If you need help quitting, ask your health care provider.  Try to reduce your stress level, such as with yoga or meditation. Talk with your health care provider if you need help. General Instructions  Watch your dizziness for any changes.  Take medicines only as directed by your health care provider. Talk with your health care provider if you think that your dizziness is caused by a medicine that you are taking.  Tell a friend or a family member that you are feeling dizzy. If he or she notices any changes in your behavior, have this person call your health care provider.  Keep all follow-up visits as directed by your health care provider. This is important. SEEK MEDICAL CARE IF:  Your dizziness does not go away.  Your dizziness or  light-headedness gets worse.  You feel nauseous.  You have reduced hearing.  You have new symptoms.  You are unsteady on your feet or you feel like the room is spinning. SEEK IMMEDIATE MEDICAL CARE IF:  You vomit or have diarrhea and are unable to eat or drink anything.  You have problems talking, walking, swallowing, or using your arms, hands, or legs.  You feel generally weak.  You are not thinking clearly or you have trouble forming sentences. It may take a friend or family member to notice this.  You have chest pain, abdominal pain, shortness of breath, or sweating.  Your vision changes.  You notice any bleeding.  You have a headache.  You have neck pain or a stiff neck.  You have a fever.   This information is not intended to replace advice given to you by your health care provider. Make sure you discuss any questions you have with your health care provider.   Document Released: 11/27/2000 Document Revised: 10/18/2014 Document Reviewed: 05/30/2014 Elsevier Interactive Patient Education 2016 [employer].   HPV (Human Papillomavirus) Vaccine--Gardasil-9:  1. Why get vaccinated? Gardasil-9 prevents human  papillomavirus (HPV) types that cause many cancers, including:  cervical cancer in females,  vaginal and vulvar cancers in females,  anal cancer in females and males,  throat cancer in females and males, and  penile cancer in males. In addition, Gardasil-9 prevents HPV types that cause genital warts in both females and males. In the [place] about 12,000 women get cervical cancer every year, and about 4,000 women die from it. Eleonore Chiquito can prevent most of these cases of cervical cancer. Vaccination is not a substitute for cervical cancer screening. This vaccine does not protect against all HPV types that can cause cervical cancer. Women should still get regular Pap tests. HPV infection usually comes from sexual contact, and most people will become infected at some point in their life. About 14 million Americans, including teens, get infected every year. Most infections will go away and not cause serious problems. But thousands of women and men get cancer and diseases from HPV. 2. HPV vaccine Eleonore Chiquito is an FDA-approved HPV vaccine. It is recommended for both males and females. It is routinely given at 82 or 25 years of age, but it may be given beginning at age 44 years through age 80 years. Three doses of Gardasil-9 are recommended with the second dose given 1-2 months after the first dose and the third dose given 6 months after the first dose. 3. Some people should not get this vaccine  Anyone who has had a severe, life-threatening allergic reaction to a dose of HPV vaccine should not get another dose.  Anyone who has a severe (life threatening) allergy to any component of HPV vaccine should not get the vaccine. Tell your doctor if you have any severe allergies that you know of, including a severe allergy to yeast.  HPV vaccine is not recommended for pregnant women. If you learn that you were pregnant when you were vaccinated, there is no reason to expect any problems for you or your baby.  Any woman who learns she was pregnant when she got Gardasil-9 vaccine is encouraged to contact the manufacturer's registry for HPV vaccination during pregnancy at (682)027-8102. Women who are breastfeeding may be vaccinated.  If you have a mild illness, such as a cold, you can probably get the vaccine today. If you are moderately or severely ill, you should probably wait until you recover. Your doctor  can advise you. 4. Risks of a vaccine reaction With any medicine, including vaccines, there is a chance of side effects. These are usually mild and go away on their own, but serious reactions are also possible. Most people who get HPV vaccine do not have any serious problems with it. Mild or moderate problems following Gardasil-9:  Reactions in the arm where the shot was given:  Soreness (about 9 people in 10)  Redness or swelling (about 1 person in 3)  Fever:  Mild (100F) (about 1 person in 10)  Moderate (102F) (about 1 person in 65)  Other problems:  Headache (about 1 person in 3) Problems that could happen after any injected vaccine:  People sometimes faint after a medical procedure, including vaccination. Sitting or lying down for about 15 minutes can help prevent fainting, and injuries caused by a fall. Tell your doctor if you feel dizzy, or have vision changes or ringing in the ears.  Some people get severe pain in the shoulder and have difficulty moving the arm where a shot was given. This happens very rarely.  Any medication can cause a severe allergic reaction. Such reactions from a vaccine are very rare, estimated at about 1 in a million doses, and would happen within a few minutes to a few hours after the vaccination. As with any medicine, there is a very remote chance of a vaccine causing a serious injury or death. The safety of vaccines is always being monitored. For more information, visit: http://floyd.org/. 5. What if there is a serious reaction? What should  I look for? Look for anything that concerns you, such as signs of a severe allergic reaction, very high fever, or unusual behavior. Signs of a severe allergic reaction can include hives, swelling of the face and throat, difficulty breathing, a fast heartbeat, dizziness, and weakness. These would usually start a few minutes to a few hours after the vaccination. What should I do? If you think it is a severe allergic reaction or other emergency that can't wait, call 9-1-1 or get to the nearest hospital. Otherwise, call your doctor. Afterward, the reaction should be reported to the "Vaccine Adverse Event Reporting System" (VAERS). Your doctor might file this report, or you can do it yourself through the VAERS web site at www.vaers.LAgents.no, or by calling 1-5094607435. VAERS does not give medical advice. 6. The National Vaccine Injury Compensation Program The [employer] Vaccine Injury Compensation Program (VICP) is a federal program that was created to compensate people who may have been injured by certain vaccines. Persons who believe they may have been injured by a vaccine can learn about the program and about filing a claim by calling 1-781-707-5031 or visiting the VICP website at SpiritualWord.at. There is a time limit to file a claim for compensation. 7. How can I learn more?  Ask your health care provider. He or she can give you the vaccine package insert or suggest other sources of information.  Call your local or state health department.  Contact the Centers for Disease Control and Prevention (CDC):  Call 865 734 5438 (1-800-CDC-INFO) or  Visit CDC's website at RunningConvention.de Vaccine Information Statement HPV Vaccine Eleonore Chiquito) 09/15/14   This information is not intended to replace advice given to you by your health care provider. Make sure you discuss any questions you have with your health care provider.   Document Released: 12/29/2013 Document Revised: 10/18/2014  Document Reviewed: 12/29/2013 Elsevier Interactive Patient Education [employer]   IF you received an x-ray today, you  will receive an invoice from [facility]. Please contact [facility] at [phone] with questions or concerns regarding your invoice.   IF you received labwork today, you will receive an invoice from [employer]. Please contact [facility] at [phone] with questions or concerns regarding your invoice.   Our billing staff will not be able to assist you with questions regarding bills from these companies.  You will be contacted with the lab results as soon as they are available. The fastest way to get your results is to activate your My Chart account. Instructions are located on the last page of this paperwork. If you have not heard from [place] regarding the results in 2 weeks, please contact this office.        I personally performed the services described in this documentation, which was scribed in my presence. The recorded information has been reviewed and considered, and addended by me as needed.   Signed,   Meredith Staggers, MD [facility].  03/13/16 2:55 PM

## 2016-08-02 ENCOUNTER — Ambulatory Visit: Payer: BLUE CROSS/BLUE SHIELD

## 2016-08-14 ENCOUNTER — Ambulatory Visit (INDEPENDENT_AMBULATORY_CARE_PROVIDER_SITE_OTHER): Payer: BLUE CROSS/BLUE SHIELD | Admitting: Physician Assistant

## 2016-08-14 VITALS — BP 116/78 | HR 84 | Temp 98.4°F | Resp 16 | Ht 71.0 in | Wt 171.0 lb

## 2016-08-14 DIAGNOSIS — R42 Dizziness and giddiness: Secondary | ICD-10-CM

## 2016-08-14 DIAGNOSIS — R634 Abnormal weight loss: Secondary | ICD-10-CM | POA: Diagnosis not present

## 2016-08-14 DIAGNOSIS — R519 Headache, unspecified: Secondary | ICD-10-CM

## 2016-08-14 DIAGNOSIS — R51 Headache: Secondary | ICD-10-CM

## 2016-08-14 DIAGNOSIS — R110 Nausea: Secondary | ICD-10-CM

## 2016-08-14 NOTE — Progress Notes (Signed)
[facility] [address], [place] [id number] [phone]  Date:  08/14/2016   Name:  Corey Freeman   DOB:  05-28-1991   MRN:  [id number]  PCP:  No PCP Per Patient    History of Present Illness:  Corey Freeman is a 26 y.o. male patient who presents to [facility] for cc of head pain.    When he touches the back of his head, there is tenderness as well as if he tilts the head back, on the back of his head. He has also noticed a change in his weight 2 months ago, with 2-3 lbs, despite no change in diet and or activity.  Weight loss of...  Wt Readings from Last 3 Encounters:  08/14/16 171 lb (77.6 kg)  03/12/16 181 lb 6.4 oz (82.3 kg)  10/30/15 195 lb (88.5 kg)  At this time, he states that he is trying to gain weight, with eating more.   Headache starts when he has not eaten anything.  Once he eats, the head pain will mostly resolve.  He takes 468m of ibuprofen which makes it bearable.  Pain is 9/10.  Feels like the back of his head wants to explode.  Pulling on hair will cause head pain.  He has to be careful with washing the back of his head.  He currently has no head pain.  The head pain has been for 2 months, but intermittent of severity 2-9/10.  Pain last 30-45 minutes.  He will have auras.  If he moves eyes side to side too fast, he will see spots.  He will also have numbness in the back of his legs.  There is associated nausea, mild photophobia, no phonophobia.  No hx of seizures. Has pre-syncope sensation, however no syncope he can report.       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  Concentration can be difficult with the headaches.    Diabetes and high blood pressure run in family.    No etOH use.  Non-smoker.   No familial or personal hx of cancer or auto-immune illnesses.    There are no active problems to display for this patient.   No past medical history on file.  No past surgical history on file.  Social History   Substance Use Topics  . Smoking status: Never Smoker  . Smokeless tobacco: Never Used  . Alcohol use 0.6 oz/week    1 Cans of beer per week     Comment: twice a month socially    Family History  Problem Relation Age of Onset  . Diabetes Maternal Grandmother   . Hypertension Maternal Grandmother   . Diabetes Maternal Grandfather   . Diabetes Paternal Grandmother     No Known Allergies  Medication list has been reviewed and updated.  Current Outpatient Prescriptions on File Prior to Visit  Medication Sig Dispense Refill  . hydrOXYzine (ATARAX/VISTARIL) 25 MG tablet TAKE 1 TABLET (25 MG TOTAL) BY MOUTH 3 (THREE) TIMES DAILY AS NEEDED. (Patient not taking: Reported on 08/14/2016) 270 tablet 1   No current facility-administered medications on file prior to visit.     ROS   Physical Examination: BP 116/78   Pulse 84   Temp 98.4 F (36.9 C) (Oral)   Resp 16   Ht _0  (1.803 m)   Wt 171 lb (77.6 kg)   SpO2 100%   BMI 23.85 kg/m  Ideal Body Weight:  Weight in (lb) to have BMI = 25: 178.9  Physical Exam  Constitutional: He is oriented to person, place, and time. He appears well-developed and well-nourished. No distress.  HENT:  Head: Normocephalic and atraumatic.  Right Ear: Tympanic membrane, external ear and ear canal normal.  Left Ear: Tympanic membrane, external ear and ear canal normal.  Head is normal without erythema, thinning, or swelling.  No tenderness upon palpation.   Eyes: Conjunctivae and EOM are normal. Pupils are equal, round, and reactive to light.  Cardiovascular: Normal rate and regular rhythm.  Exam reveals no friction rub.   No murmur heard. Pulses:      Carotid pulses are 2+ on the right side, and 2+ on the left side.      Radial pulses are 2+ on the right side, and 2+ on the left side.       Dorsalis pedis pulses are 2+ on the right side, and 2+ on the left side.  Pulmonary/Chest: Effort normal. No respiratory distress. He has no decreased  breath sounds. He has no wheezes. He has no rhonchi.  Abdominal: Soft. Bowel sounds are normal. He exhibits no distension and no mass. There is no tenderness.  Musculoskeletal: Normal range of motion. He exhibits no edema or tenderness.  Neurological: He is alert and oriented to person, place, and time. He has normal strength. He displays normal reflexes. No cranial nerve deficit. He exhibits normal muscle tone. Coordination and gait normal.  Reflex Scores:      Brachioradialis reflexes are 2+ on the right side and 2+ on the left side.      Patellar reflexes are 2+ on the right side and 2+ on the left side. f to n, h to shin normal Normal rapid extremity movement Tandem gait normal Pronator drift is negative.  Skin: Skin is warm and dry. He is not diaphoretic.  Psychiatric: He has a normal mood and affect. His behavior is normal.   CT imaging reviewed of the head 2016, and normal.    Assessment and Plan: Corey Freeman is a 26 y.o. male who is here today for migraine 2 months with weight loss. Possible anxiety, but with weight loss and erratic symptoms--imaging is necessary to rule out malignancy.   Nonintractable headache, unspecified chronicity pattern, unspecified headache type - Plan: CBC, CMP14+EGFR, Sedimentation Rate, MR MRA HEAD W CONTRAST  Nausea without vomiting - Plan: MR MRA HEAD W CONTRAST  Loss of weight - Plan: MR MRA HEAD W CONTRAST  Dizziness - Plan: MR MRA HEAD W CONTRAST  Ivar Drape, PA-C [facility] 2/28/20187:27 PM

## 2016-08-14 NOTE — Patient Instructions (Addendum)
Please await contact for MRI.   You can continue to use the ibuprofen for the pain as this is working.   No heavy or strenuous movement at this time. If your pain increases in severity, fainting, seizure, fever--return immediately.  General Headache Without Cause A headache is pain or discomfort felt around the head or neck area. There are many causes and types of headaches. In some cases, the cause may not be found. Follow these instructions at home: Managing pain   Take over-the-counter and prescription medicines only as told by your doctor.  Lie down in a dark, quiet room when you have a headache.  If directed, apply ice to the head and neck area:  Put ice in a plastic bag.  Place a towel between your skin and the bag.  Leave the ice on for 20 minutes, 2-3 times per day.  Use a heating pad or hot shower to apply heat to the head and neck area as told by your doctor.  Keep lights dim if bright lights bother you or make your headaches worse. Eating and drinking   Eat meals on a regular schedule.  Lessen how much alcohol you drink.  Lessen how much caffeine you drink, or stop drinking caffeine. General instructions   Keep all follow-up visits as told by your doctor. This is important.  Keep a journal to find out if certain things bring on headaches. For example, write down:  What you eat and drink.  How much sleep you get.  Any change to your diet or medicines.  Relax by getting a massage or doing other relaxing activities.  Lessen stress.  Sit up straight. Do not tighten (tense) your muscles.  Do not use tobacco products. This includes cigarettes, chewing tobacco, or e-cigarettes. If you need help quitting, ask your doctor.  Exercise regularly as told by your doctor.  Get enough sleep. This often means 7-9 hours of sleep. Contact a doctor if:  Your symptoms are not helped by medicine.  You have a headache that feels different than the other  headaches.  You feel sick to your stomach (nauseous) or you throw up (vomit).  You have a fever. Get help right away if:  Your headache becomes really bad.  You keep throwing up.  You have a stiff neck.  You have trouble seeing.  You have trouble speaking.  You have pain in the eye or ear.  Your muscles are weak or you lose muscle control.  You lose your balance or have trouble walking.  You feel like you will pass out (faint) or you pass out.  You have confusion. This information is not intended to replace advice given to you by your health care provider. Make sure you discuss any questions you have with your health care provider. Document Released: 03/12/2008 Document Revised: 11/09/2015 Document Reviewed: 09/26/2014 Elsevier Interactive Patient Education  2017 [employer]    IF you received an x-ray today, you will receive an invoice from [facility]. Please contact [facility] at [phone] with questions or concerns regarding your invoice.   IF you received labwork today, you will receive an invoice from [facility]. Please contact [facility] at [phone] with questions or concerns regarding your invoice.   Our billing staff will not be able to assist you with questions regarding bills from these companies.  You will be contacted with the lab results as soon as they are available. The fastest way to get your results is to activate your My Chart  account. Instructions are located on the last page of this paperwork. If you have not heard from [place] regarding the results in 2 weeks, please contact this office.

## 2016-08-14 NOTE — Progress Notes (Deleted)
[facility] [address], [place] [address] [phone]  Date:  08/14/2016   Name:  Corey Freeman   DOB:  12/25/1990   MRN:  [id number]  PCP:  No PCP Per Patient    History of Present Illness:  Corey LegatoLuis Freeman is a 26 y.o. male patient who [facility] cc of migraine and weight loss.     Wt Readings from Last 3 Encounters:  08/14/16 171 lb (77.6 kg)  03/12/16 181 lb 6.4 oz (82.3 kg)  10/30/15 195 lb (88.5 kg)     There are no active problems to display for this patient.   No past medical history on file.  No past surgical history on file.  Social History  Substance Use Topics  . Smoking status: Never Smoker  . Smokeless tobacco: Never Used  . Alcohol use 0.6 oz/week    1 Cans of beer per week     Comment: twice a month socially    Family History  Problem Relation Age of Onset  . Diabetes Maternal Grandmother   . Hypertension Maternal Grandmother   . Diabetes Maternal Grandfather   . Diabetes Paternal Grandmother     No Known Allergies  Medication list has been reviewed and updated.  Current Outpatient Prescriptions on File Prior to Visit  Medication Sig Dispense Refill  . hydrOXYzine (ATARAX/VISTARIL) 25 MG tablet TAKE 1 TABLET (25 MG TOTAL) BY MOUTH 3 (THREE) TIMES DAILY AS NEEDED. (Patient not taking: Reported on 08/14/2016) 270 tablet 1   No current facility-administered medications on file prior to visit.     ROS   Physical Examination: BP 116/78   Pulse 84   Temp 98.4 F (36.9 C) (Oral)   Resp 16   Ht 5\' 11"  (1.803 m)   Wt 171 lb (77.6 kg)   SpO2 100%   BMI 23.85 kg/m  Ideal Body Weight: Weight in (lb) to have BMI = 25: 178.9  Physical Exam   Assessment and Plan: Corey LegatoLuis Pinney is a 26 y.o. male who is here today  There are no diagnoses linked to this encounter.  Trena PlattStephanie Lashawne Dura, PA-C [facility] 08/14/2016 11:11 AM

## 2016-08-15 LAB — CMP14+EGFR
ALK PHOS: 68 IU/L (ref 39–117)
ALT: 22 IU/L (ref 0–44)
AST: 18 IU/L (ref 0–40)
Albumin/Globulin Ratio: 1.7 (ref 1.2–2.2)
Albumin: 4.9 g/dL (ref 3.5–5.5)
BUN/Creatinine Ratio: 14 (ref 9–20)
BUN: 13 mg/dL (ref 6–20)
Bilirubin Total: 0.4 mg/dL (ref 0.0–1.2)
CALCIUM: 10 mg/dL (ref 8.7–10.2)
CHLORIDE: 100 mmol/L (ref 96–106)
CO2: 20 mmol/L (ref 18–29)
CREATININE: 0.91 mg/dL (ref 0.76–1.27)
GFR calc Af Amer: 134 mL/min/{1.73_m2} (ref >59)
GFR calc non Af Amer: 116 mL/min/{1.73_m2} (ref >59)
GLUCOSE: 95 mg/dL (ref 65–99)
Globulin, Total: 2.9 g/dL (ref 1.5–4.5)
POTASSIUM: 4 mmol/L (ref 3.5–5.2)
Sodium: 142 mmol/L (ref 134–144)
Total Protein: 7.8 g/dL (ref 6.0–8.5)

## 2016-08-15 LAB — CBC
Hematocrit: 47.1 % (ref 37.5–51.0)
Hemoglobin: 16.4 g/dL (ref 13.0–17.7)
MCH: 30.7 pg (ref 26.6–33.0)
MCHC: 34.8 g/dL (ref 31.5–35.7)
MCV: 88 fL (ref 79–97)
Platelets: 246 10*3/uL (ref 150–379)
RBC: 5.35 x10E6/uL (ref 4.14–5.80)
RDW: 13.8 % (ref 12.3–15.4)
WBC: 8.3 10*3/uL (ref 3.4–10.8)

## 2016-08-15 LAB — SEDIMENTATION RATE: Sed Rate: 4 mm/hr (ref 0–15)

## 2016-08-20 ENCOUNTER — Emergency Department (HOSPITAL_COMMUNITY)
Admission: EM | Admit: 2016-08-20 | Discharge: 2016-08-20 | Disposition: A | Discharge status: Home / Self Care | Payer: BLUE CROSS/BLUE SHIELD | Attending: Emergency Medicine | Admitting: Emergency Medicine

## 2016-08-20 ENCOUNTER — Encounter (HOSPITAL_COMMUNITY): Payer: Self-pay

## 2016-08-20 ENCOUNTER — Emergency Department (HOSPITAL_COMMUNITY): Payer: BLUE CROSS/BLUE SHIELD

## 2016-08-20 DIAGNOSIS — R519 Headache, unspecified: Secondary | ICD-10-CM

## 2016-08-20 DIAGNOSIS — R51 Headache: Secondary | ICD-10-CM | POA: Insufficient documentation

## 2016-08-20 DIAGNOSIS — J029 Acute pharyngitis, unspecified: Secondary | ICD-10-CM | POA: Diagnosis not present

## 2016-08-20 LAB — CBC WITH DIFFERENTIAL/PLATELET
BASOS ABS: 0 10*3/uL (ref 0.0–0.1)
BASOS PCT: 0 %
EOS ABS: 0.4 10*3/uL (ref 0.0–0.7)
EOS PCT: 4 %
HEMATOCRIT: 45 % (ref 39.0–52.0)
Hemoglobin: 15.9 g/dL (ref 13.0–17.0)
Lymphocytes Relative: 14 %
Lymphs Abs: 1.5 10*3/uL (ref 0.7–4.0)
MCH: 30.8 pg (ref 26.0–34.0)
MCHC: 35.3 g/dL (ref 30.0–36.0)
MCV: 87 fL (ref 78.0–100.0)
MONO ABS: 1 10*3/uL (ref 0.1–1.0)
MONOS PCT: 9 %
Neutro Abs: 7.9 10*3/uL — ABNORMAL HIGH (ref 1.7–7.7)
Neutrophils Relative %: 73 %
PLATELETS: 196 10*3/uL (ref 150–400)
RBC: 5.17 MIL/uL (ref 4.22–5.81)
RDW: 13.2 % (ref 11.5–15.5)
WBC: 10.8 10*3/uL — ABNORMAL HIGH (ref 4.0–10.5)

## 2016-08-20 LAB — COMPREHENSIVE METABOLIC PANEL
ALBUMIN: 4.4 g/dL (ref 3.5–5.0)
ALT: 20 U/L (ref 17–63)
ANION GAP: 11 (ref 5–15)
AST: 20 U/L (ref 15–41)
Alkaline Phosphatase: 64 U/L (ref 38–126)
BUN: 10 mg/dL (ref 6–20)
CHLORIDE: 102 mmol/L (ref 101–111)
CO2: 24 mmol/L (ref 22–32)
Calcium: 9.5 mg/dL (ref 8.9–10.3)
Creatinine, Ser: 0.89 mg/dL (ref 0.61–1.24)
GFR calc Af Amer: >60 mL/min (ref >60)
GFR calc non Af Amer: >60 mL/min (ref >60)
GLUCOSE: 90 mg/dL (ref 65–99)
POTASSIUM: 3.7 mmol/L (ref 3.5–5.1)
SODIUM: 137 mmol/L (ref 135–145)
TOTAL PROTEIN: 7.9 g/dL (ref 6.5–8.1)
Total Bilirubin: 0.5 mg/dL (ref 0.3–1.2)

## 2016-08-20 LAB — RAPID STREP SCREEN (MED CTR MEBANE ONLY): STREPTOCOCCUS, GROUP A SCREEN (DIRECT): NEGATIVE

## 2016-08-20 MED ORDER — KETOROLAC TROMETHAMINE 30 MG/ML IJ SOLN
30.0000 mg | Freq: Once | INTRAMUSCULAR | Status: AC
  Administered 2016-08-20: 30 mg via INTRAVENOUS
  Filled 2016-08-20: qty 1

## 2016-08-20 MED ORDER — METOCLOPRAMIDE HCL 5 MG/ML IJ SOLN
10.0000 mg | Freq: Once | INTRAMUSCULAR | Status: AC
  Administered 2016-08-20: 10 mg via INTRAVENOUS
  Filled 2016-08-20: qty 2

## 2016-08-20 MED ORDER — SODIUM CHLORIDE 0.9 % IV BOLUS (SEPSIS)
1000.0000 mL | Freq: Once | INTRAVENOUS | Status: AC
  Administered 2016-08-20: 1000 mL via INTRAVENOUS

## 2016-08-20 NOTE — ED Provider Notes (Signed)
[facility]-EMERGENCY DEPT Provider Note   CSN: [id number] Arrival date & time: 08/20/16  0830     History   Chief Complaint Chief Complaint  Patient presents with  . Headache  . Sore Throat    HPI Corey Freeman is a 26 y.o. male.  HPI  26 year old male who presents with sore throat and headache. He has no significant past medical history. Reports yesterday developed sore throat and subjective fevers and chills yesterday evening. Has had mild congestion, but no cough, runny nose. This caused him to have posterior headache that is worsened. No vomiting but nausea. No vision or speech changes, focal numbness or weakness. Associated with some intermittent dizziness sometimes. States that this headache has been off and on for several weeks now. It does improve with ibuprofen usage which he is taking every 4 hours around-the-clock. He has been seen by his PCP for this and scheduled for an outpatient MRI within the next 1-2 weeks. He has noted 10 pound weight loss since December. This is not fully intentional. States that he feels dizzy when he turns his neck. It is intermittently associated with blurry vision to when he turns his neck from side to side. Headache tends to be worse later on in the day. It does not wake him up from sleep. No other modifying factors. It is not positional.  History reviewed. No pertinent past medical history.  There are no active problems to display for this patient.   Past Surgical History:  Procedure Laterality Date  . WISDOM TOOTH EXTRACTION         Home Medications    Prior to Admission medications   Medication Sig Start Date End Date Taking? Authorizing Provider  ibuprofen (ADVIL,MOTRIN) 200 MG tablet Take 200 mg by mouth every 6 (six) hours as needed for moderate pain.   Yes Historical Provider, MD  hydrOXYzine (ATARAX/VISTARIL) 25 MG tablet TAKE 1 TABLET (25 MG TOTAL) BY MOUTH 3 (THREE) TIMES DAILY AS NEEDED. Patient not taking: Reported on 08/14/2016  11/15/15   Porfirio Oar, PA-C    Family History Family History  Problem Relation Age of Onset  . Diabetes Maternal Grandmother   . Hypertension Maternal Grandmother   . Diabetes Maternal Grandfather   . Diabetes Paternal Grandmother     Social History Social History  Substance Use Topics  . Smoking status: Never Smoker  . Smokeless tobacco: Never Used  . Alcohol use 0.6 oz/week    1 Cans of beer per week     Comment: twice a month socially     Allergies   Patient has no known allergies.   Review of Systems Review of Systems 10/14 systems reviewed and are negative other than those stated in the HPI   Physical Exam Updated Vital Signs BP 118/58   Pulse 89   Temp 99.2 F (37.3 C) (Oral)   Resp 16   Ht 5\' 11"  (1.803 m)   Wt 170 lb (77.1 kg)   SpO2 99%   BMI 23.71 kg/m   Physical Exam Neurological:  Alert, oriented to person, place, time, and situation. Memory grossly in tact. Fluent speech. No dysarthria or aphasia.  Cranial nerves: VF are full. EOMI without nystagmus. No gaze deviation. Facial muscles symmetric with activation. Sensation to light touch over face in tact bilaterally. Hearing grossly in tact. Palate elevates symmetrically. Head turn and shoulder shrug are intact. Tongue midline.  Reflexes defered.  Muscle bulk and tone normal. No pronator drift. Moves all extremities symmetrically. Sensation to  light touch is in tact throughout in bilateral upper and lower extremities. Coordination reveals no dysmetria with finger to nose. Gait is narrow-based and steady. Non-ataxic. Tandem gait normal  Physical Exam  Nursing note and vitals reviewed. Constitutional: Well developed, well nourished, non-toxic, and in no acute distress Head: Normocephalic and atraumatic.  Mouth/Throat: Oropharynx is clear and moist.  Neck: Normal range of motion. Neck supple.  no meningismus Cardiovascular: Normal rate and regular rhythm.   Pulmonary/Chest: Effort normal and  breath sounds normal.  Abdominal: Soft. There is no tenderness. There is no rebound and no guarding.  Musculoskeletal: Normal range of motion.  Skin: Skin is warm and dry.  Psychiatric: Cooperative   ED Treatments / Results  Labs (all labs ordered are listed, but only abnormal results are displayed) Labs Reviewed  CBC WITH DIFFERENTIAL/PLATELET - Abnormal; Notable for the following:       Result Value   WBC 10.8 (*)    Neutro Abs 7.9 (*)    All other components within normal limits  RAPID STREP SCREEN (NOT AT [facility])  CULTURE, GROUP A STREP [facility])  COMPREHENSIVE METABOLIC PANEL    EKG  EKG Interpretation None       Radiology Ct Head Wo Contrast  Result Date: 08/20/2016 CLINICAL DATA:  Headache for the last 2 months. EXAM: CT HEAD WITHOUT CONTRAST TECHNIQUE: Contiguous axial images were obtained from the base of the skull through the vertex without intravenous contrast. COMPARISON:  06/20/2015 FINDINGS: Brain: No acute intracranial abnormality. Specifically, no hemorrhage, hydrocephalus, mass lesion, acute infarction, or significant intracranial injury. Vascular: No hyperdense vessel or unexpected calcification. Skull: No acute calvarial abnormality. Sinuses/Orbits: opacification of a single posterior left ethmoid air cell. Paranasal sinuses are otherwise clear. Mastoid air cells are clear. Orbital soft tissues unremarkable. Other:  None IMPRESSION: No intracranial abnormality. Electronically Signed   By: Charlett Nose M.D.   On: 08/20/2016 10:19    Procedures Procedures (including critical care time)  Medications Ordered in ED Medications  sodium chloride 0.9 % bolus 1,000 mL (1,000 mLs Intravenous New Bag/Given 08/20/16 1000)  ketorolac (TORADOL) 30 MG/ML injection 30 mg (30 mg Intravenous Given 08/20/16 1000)  metoCLOPramide (REGLAN) injection 10 mg (10 mg Intravenous Given 08/20/16 1000)     Initial Impression / Assessment and Plan / ED Course  I have reviewed the triage vital  signs and the nursing notes.  Pertinent labs & imaging results that were available during my care of the patient were reviewed by me and considered in my medical decision making (see chart for details).     Presenting with sore throat starting yesterday and intermittent headaches for several weeks to months. He is well-appearing in no acute distress. Initially mildly tachycardic, but this resolves after IV fluids and migraine cocktail. He has a normal neurological exam. No fever or meningismus or other features that would suggest meningitis or serious intracranial infection. Headache has been intermittent, not of sudden onset maximal intensity to suggest subarachnoid hemorrhage. I did perform CT head which is visualized does not show acute intracranial processes. He is continuing with MRI of the brain that I will be scheduled through his PCPs office over the next 1-2 weeks, which he is encouraged to continue. Blood work is reassuring. Strep test is negative.  he Likely has viral pharyngitis that is worsening his headache. Also question rebound analgesic headache as he is been taking ibuprofen every day during this time around the clock. We'll still need MRI that is getting scheduled through  PCP as an outpatient to rule out space-occupying lesion among other processes, but given his normal neurological exam and benign presentation today do not think he emergently needs this for the ED. Strict return and follow-up instructions reviewed. He expressed understanding of all discharge instructions and felt comfortable with the plan of care.   Final Clinical Impressions(s) / ED Diagnoses   Final diagnoses:  Viral pharyngitis  Acute nonintractable headache, unspecified headache type    New Prescriptions New Prescriptions   No medications on file     Lavera Guiseana Duo Kadisha Goodine, MD 08/20/16 1225

## 2016-08-20 NOTE — ED Notes (Signed)
Pt verbalized understanding of discharge instructions and denies any further questions at this time.   

## 2016-08-20 NOTE — Discharge Instructions (Signed)
Your blood work is reassuring and the CT scan of her head is normal. Please continue to go on to get your MRI as an outpatient that is scheduled by her primary care doctor within the next 1-2 weeks.  Avoid using ibuprofen around-the-clock daily, as they can eventually causes bad rebound headaches. Return without fail for worsening symptoms, including new vision or speech changes, difficulty walking, new persistent numbness or weakness, confusion, or any other symptoms concerning to you

## 2016-08-20 NOTE — ED Triage Notes (Signed)
Per Pt, PT is coming from home with complaints of sore throat and fever that started on Friday. Reports some nausea, but denies diarrhea or vomiting. Complains of headache x 1 month. Pt was seen at PCP for HA and was told their was nothing they could do except an MRI. Pt alert and oriented x4. No neuro deficits.

## 2016-08-20 NOTE — ED Notes (Signed)
Patient transported to CT 

## 2016-08-22 LAB — CULTURE, GROUP A STREP (THRC)

## 2016-09-05 ENCOUNTER — Encounter (HOSPITAL_COMMUNITY): Payer: Self-pay

## 2016-09-05 ENCOUNTER — Telehealth: Payer: Self-pay | Admitting: Physician Assistant

## 2016-09-05 ENCOUNTER — Ambulatory Visit (HOSPITAL_COMMUNITY)
Admission: RE | Admit: 2016-09-05 | Discharge: 2016-09-05 | Disposition: A | Discharge status: Home / Self Care | Payer: BLUE CROSS/BLUE SHIELD | Source: Ambulatory Visit | Attending: Physician Assistant | Admitting: Physician Assistant

## 2016-09-05 ENCOUNTER — Other Ambulatory Visit: Payer: Self-pay | Admitting: Physician Assistant

## 2016-09-05 ENCOUNTER — Other Ambulatory Visit: Payer: Self-pay | Admitting: Emergency Medicine

## 2016-09-05 ENCOUNTER — Ambulatory Visit (HOSPITAL_COMMUNITY): Admission: RE | Admit: 2016-09-05 | Payer: BLUE CROSS/BLUE SHIELD | Source: Ambulatory Visit

## 2016-09-05 DIAGNOSIS — R51 Headache: Principal | ICD-10-CM

## 2016-09-05 DIAGNOSIS — G441 Vascular headache, not elsewhere classified: Secondary | ICD-10-CM

## 2016-09-05 DIAGNOSIS — R634 Abnormal weight loss: Secondary | ICD-10-CM

## 2016-09-05 DIAGNOSIS — R519 Headache, unspecified: Secondary | ICD-10-CM

## 2016-09-22 ENCOUNTER — Ambulatory Visit
Admission: RE | Admit: 2016-09-22 | Discharge: 2016-09-22 | Disposition: A | Discharge status: Home / Self Care | Payer: BLUE CROSS/BLUE SHIELD | Source: Ambulatory Visit | Attending: Physician Assistant | Admitting: Physician Assistant

## 2016-09-22 DIAGNOSIS — R51 Headache: Principal | ICD-10-CM

## 2016-09-22 DIAGNOSIS — R519 Headache, unspecified: Secondary | ICD-10-CM

## 2016-09-22 DIAGNOSIS — R634 Abnormal weight loss: Secondary | ICD-10-CM

## 2016-09-22 MED ORDER — GADOBENATE DIMEGLUMINE 529 MG/ML IV SOLN
15.0000 mL | Freq: Once | INTRAVENOUS | Status: AC | PRN
  Administered 2016-09-22: 15 mL via INTRAVENOUS

## 2016-09-30 ENCOUNTER — Telehealth: Payer: Self-pay | Admitting: Family Medicine

## 2016-09-30 NOTE — Telephone Encounter (Signed)
Corey Freeman PT CALLING FOR MRI RESULTS

## 2016-10-01 MED ORDER — CETIRIZINE HCL 10 MG PO TABS: 10.0000 mg | ORAL_TABLET | Freq: Every day | ORAL | 1 refills | Status: AC

## 2016-11-03 NOTE — Telephone Encounter (Signed)
No note needed 

## 2017-01-03 ENCOUNTER — Encounter: Payer: BLUE CROSS/BLUE SHIELD | Admitting: Family Medicine

## 2017-01-03 ENCOUNTER — Encounter: Payer: Self-pay | Admitting: Family Medicine

## 2017-01-03 NOTE — Patient Instructions (Signed)
     IF you received an x-ray today, you will receive an invoice from [facility]. Please contact [facility] at [phone] with questions or concerns regarding your invoice.   IF you received labwork today, you will receive an invoice from [facility]. Please contact [facility] at [phone] with questions or concerns regarding your invoice.   Our billing staff will not be able to assist you with questions regarding bills from these companies.  You will be contacted with the lab results as soon as they are available. The fastest way to get your results is to activate your My Chart account. Instructions are located on the last page of this paperwork. If you have not heard from us regarding the results in 2 weeks, please contact this office.     

## 2018-03-28 IMAGING — CT CT HEAD W/O CM
3 of 4 series · 18 of 47 positions shown, 21 images · non-contrast
Comparison: 06/20/2015

CLINICAL DATA: Headache for the last 2 months.

EXAM:
CT HEAD WITHOUT CONTRAST
TECHNIQUE: Contiguous axial images were obtained from the base of the skull
through the vertex without intravenous contrast.

[Series 201: head w/o, idose (1) · axial · non-contrast · 0.44mm/px · z∈[+101,+221]mm · 12 of 29 slices shown, 15 images]
[im 3/29  brain]
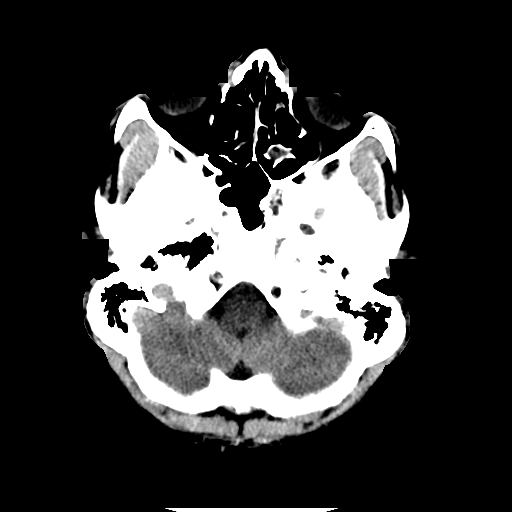
[im 3/29  bone]
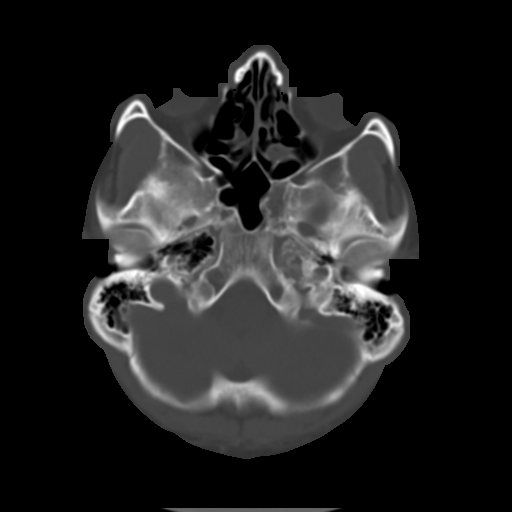
[im 5/29  brain]
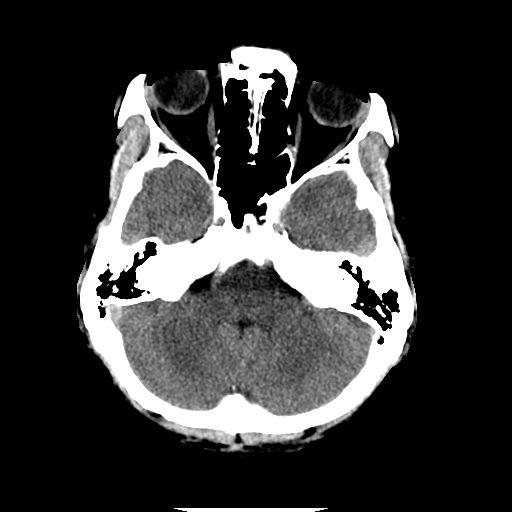
[im 7/29  brain]
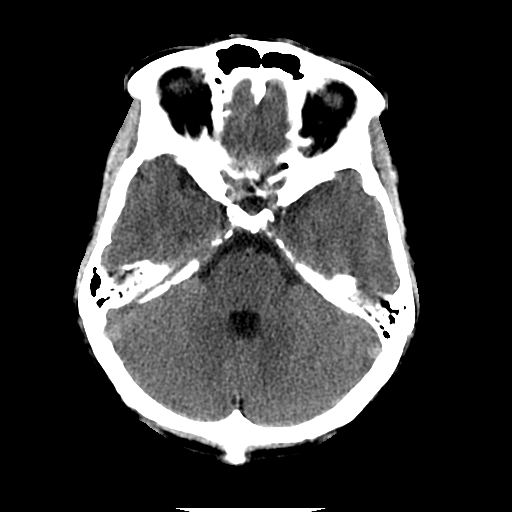
[im 9/29  brain]
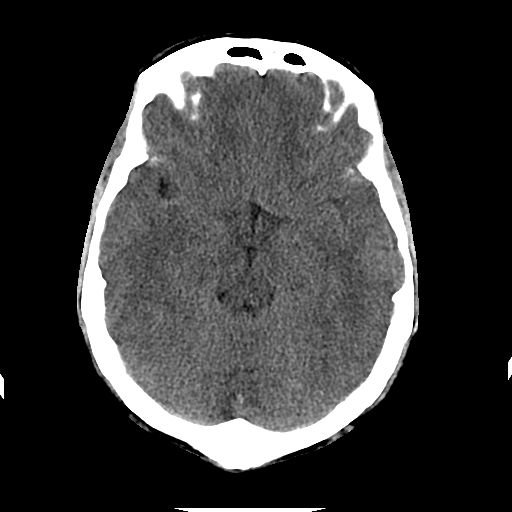
[im 11/29  brain]
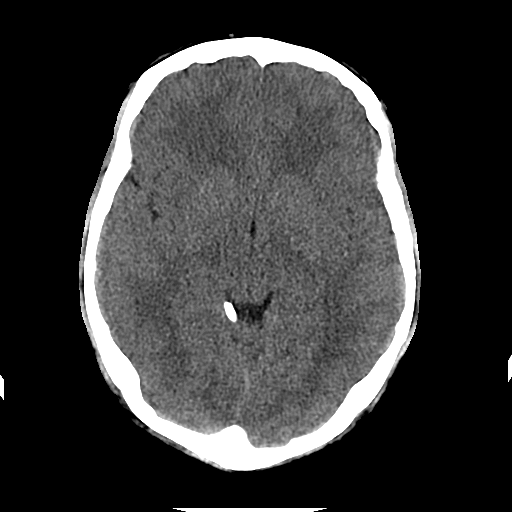
[im 11/29  bone]
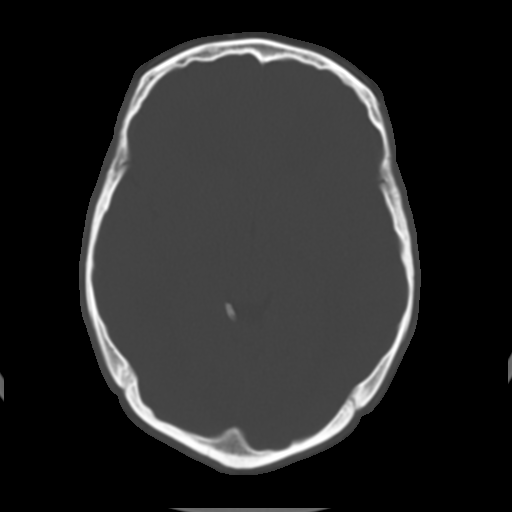
[im 13/29  brain]
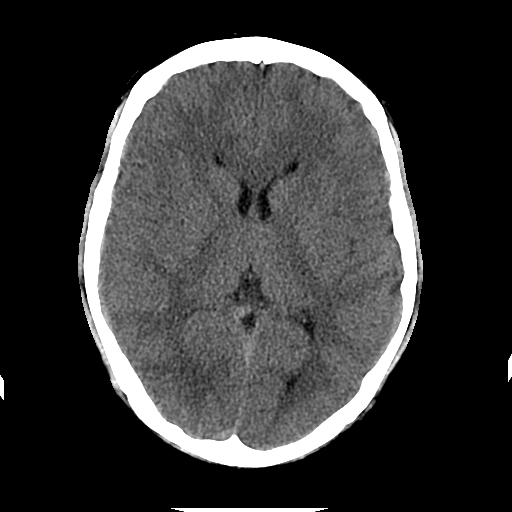
[im 17/29  brain]
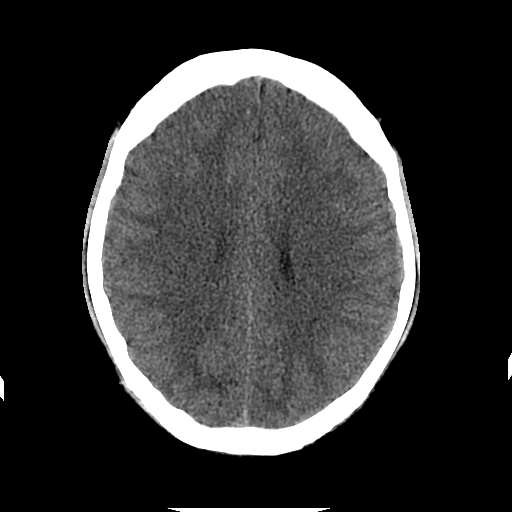
[im 19/29  brain]
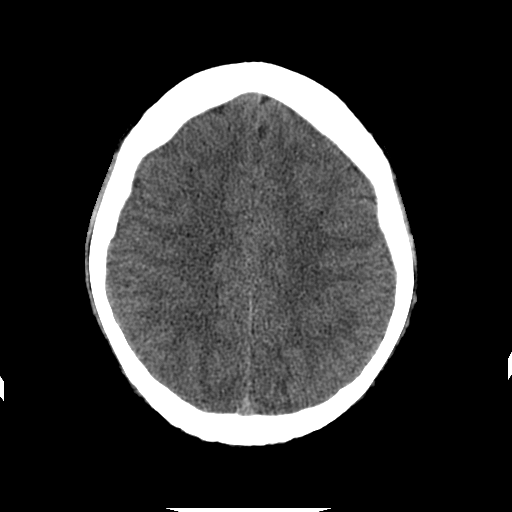
[im 21/29  brain]
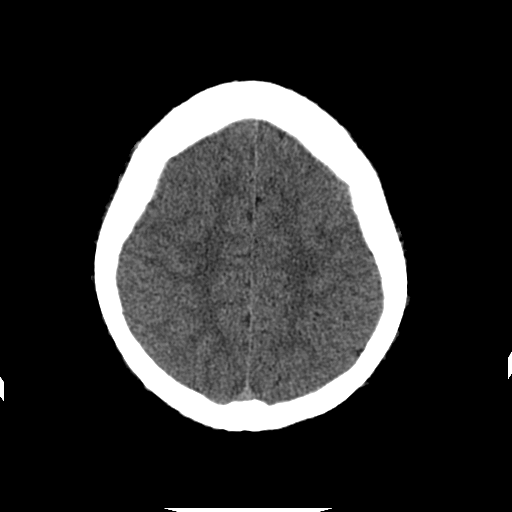
[im 21/29  bone]
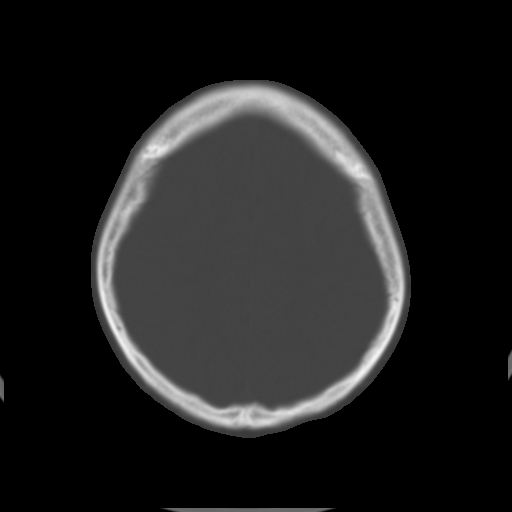
[im 23/29  brain]
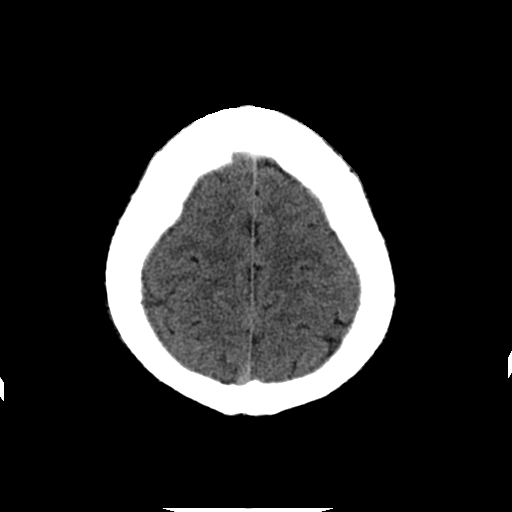
[im 25/29  brain]
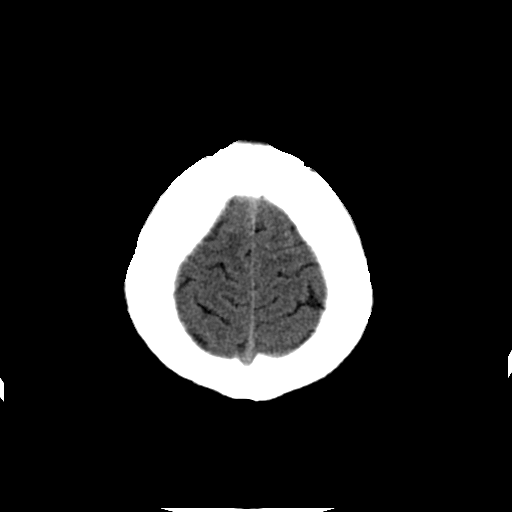
[im 27/29  brain]
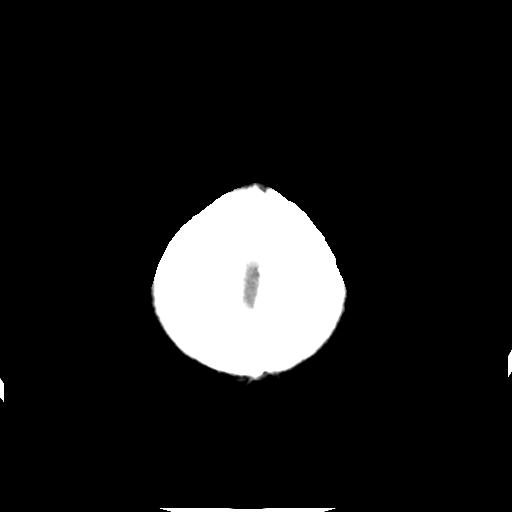

[Series 203: coronal st, idose (1) · coronal · 0.40mm/px · 3 of 71 slices shown]
[im 24/71  brain]
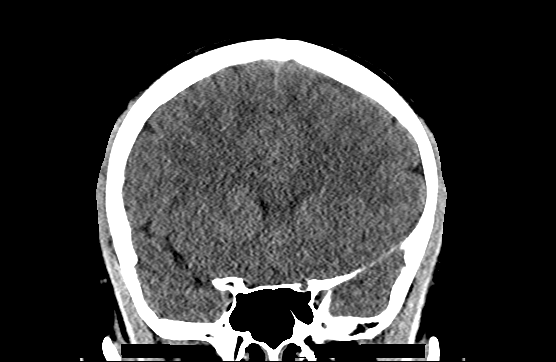
[im 32/71  brain]
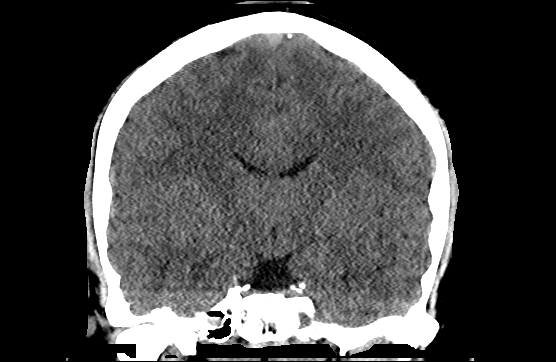
[im 39/71  brain]
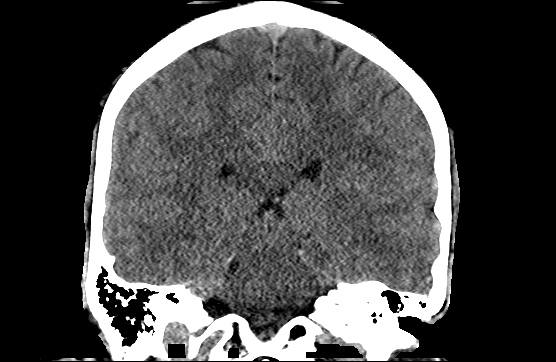

[Series 204: sagittal st, idose (1) · sagittal · 0.40mm/px · 3 of 74 slices shown]
[im 25/74  brain]
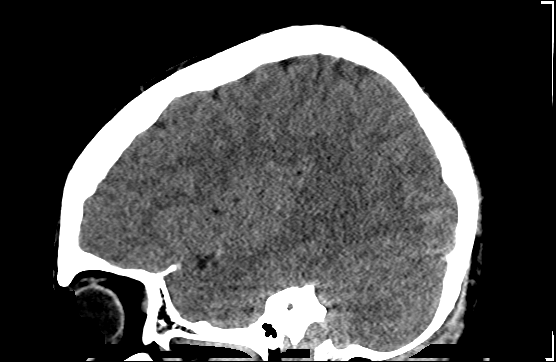
[im 37/74  brain]
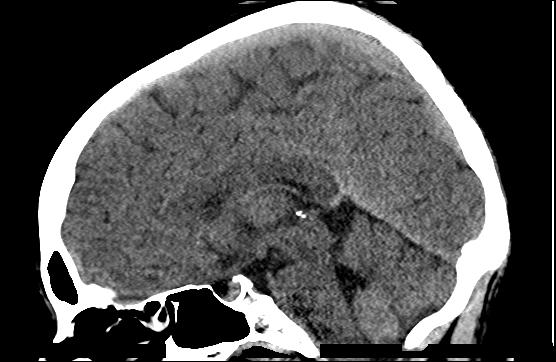
[im 49/74  brain]
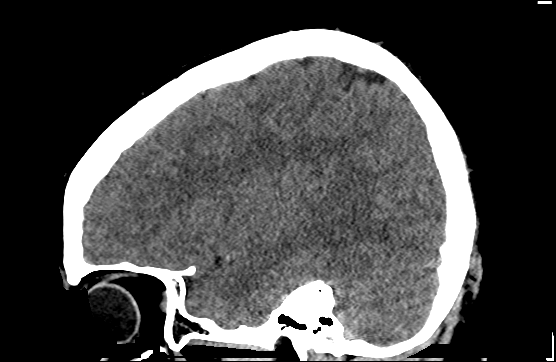

[18 of 47 positions shown; findings below may reference images not displayed]

FINDINGS: Brain: No acute intracranial abnormality. Specifically, no
hemorrhage, hydrocephalus, mass lesion, acute infarction, or
significant intracranial injury.

Vascular: No hyperdense vessel or unexpected calcification.

Skull: No acute calvarial abnormality.

Sinuses/Orbits: opacification of a single posterior left ethmoid air
cell. Paranasal sinuses are otherwise clear. Mastoid air cells are
clear. Orbital soft tissues unremarkable.

Other:  None
IMPRESSION: No intracranial abnormality.

## 2022-07-18 NOTE — Progress Notes (Signed)
This encounter was created in error - please disregard.

## 2023-09-14 ENCOUNTER — Encounter (HOSPITAL_BASED_OUTPATIENT_CLINIC_OR_DEPARTMENT_OTHER): Payer: Self-pay | Admitting: Emergency Medicine

## 2023-09-14 ENCOUNTER — Emergency Department (HOSPITAL_BASED_OUTPATIENT_CLINIC_OR_DEPARTMENT_OTHER)

## 2023-09-14 ENCOUNTER — Emergency Department (HOSPITAL_BASED_OUTPATIENT_CLINIC_OR_DEPARTMENT_OTHER)
Admission: EM | Admit: 2023-09-14 | Discharge: 2023-09-14 | Disposition: A | Discharge status: Home / Self Care | Attending: Emergency Medicine | Admitting: Emergency Medicine

## 2023-09-14 ENCOUNTER — Other Ambulatory Visit: Payer: Self-pay

## 2023-09-14 DIAGNOSIS — R0602 Shortness of breath: Secondary | ICD-10-CM | POA: Diagnosis not present

## 2023-09-14 DIAGNOSIS — R072 Precordial pain: Secondary | ICD-10-CM | POA: Diagnosis present

## 2023-09-14 LAB — CBC WITH DIFFERENTIAL/PLATELET
Abs Immature Granulocytes: 0.02 10*3/uL (ref 0.00–0.07)
Basophils Absolute: 0 10*3/uL (ref 0.0–0.1)
Basophils Relative: 0 %
Eosinophils Absolute: 0.1 10*3/uL (ref 0.0–0.5)
Eosinophils Relative: 1 %
HCT: 45.7 % (ref 39.0–52.0)
Hemoglobin: 16.2 g/dL (ref 13.0–17.0)
Immature Granulocytes: 0 %
Lymphocytes Relative: 32 %
Lymphs Abs: 3.1 10*3/uL (ref 0.7–4.0)
MCH: 29.8 pg (ref 26.0–34.0)
MCHC: 35.4 g/dL (ref 30.0–36.0)
MCV: 84.2 fL (ref 80.0–100.0)
Monocytes Absolute: 0.6 10*3/uL (ref 0.1–1.0)
Monocytes Relative: 6 %
Neutro Abs: 5.8 10*3/uL (ref 1.7–7.7)
Neutrophils Relative %: 61 %
Platelets: 265 10*3/uL (ref 150–400)
RBC: 5.43 MIL/uL (ref 4.22–5.81)
RDW: 13.4 % (ref 11.5–15.5)
WBC: 9.5 10*3/uL (ref 4.0–10.5)
nRBC: 0 % (ref 0.0–0.2)

## 2023-09-14 LAB — TROPONIN I (HIGH SENSITIVITY)
Troponin I (High Sensitivity): <2 ng/L (ref <18)
Troponin I (High Sensitivity): 2 ng/L (ref <18)

## 2023-09-14 LAB — TSH: TSH: 0.603 u[IU]/mL (ref 0.350–4.500)

## 2023-09-14 LAB — BASIC METABOLIC PANEL WITH GFR
Anion gap: 12 (ref 5–15)
BUN: 20 mg/dL (ref 6–20)
CO2: 20 mmol/L — ABNORMAL LOW (ref 22–32)
Calcium: 9.3 mg/dL (ref 8.9–10.3)
Chloride: 106 mmol/L (ref 98–111)
Creatinine, Ser: 0.93 mg/dL (ref 0.61–1.24)
GFR, Estimated: >60 mL/min (ref >60)
Glucose, Bld: 142 mg/dL — ABNORMAL HIGH (ref 70–99)
Potassium: 3.1 mmol/L — ABNORMAL LOW (ref 3.5–5.1)
Sodium: 138 mmol/L (ref 135–145)

## 2023-09-14 LAB — RAPID URINE DRUG SCREEN, HOSP PERFORMED
Amphetamines: NOT DETECTED
Barbiturates: NOT DETECTED
Benzodiazepines: NOT DETECTED
Cocaine: NOT DETECTED
Opiates: NOT DETECTED
Tetrahydrocannabinol: NOT DETECTED

## 2023-09-14 LAB — MAGNESIUM: Magnesium: 2 mg/dL (ref 1.7–2.4)

## 2023-09-14 MED ORDER — IOHEXOL 350 MG/ML SOLN
100.0000 mL | Freq: Once | INTRAVENOUS | Status: AC | PRN
  Administered 2023-09-14: 100 mL via INTRAVENOUS

## 2023-09-14 MED ORDER — KETOROLAC TROMETHAMINE 15 MG/ML IJ SOLN
15.0000 mg | Freq: Once | INTRAMUSCULAR | Status: AC
  Administered 2023-09-14: 15 mg via INTRAVENOUS
  Filled 2023-09-14: qty 1

## 2023-09-14 MED ORDER — IOHEXOL 300 MG/ML  SOLN: 75.0000 mL | Freq: Once | INTRAMUSCULAR | Status: DC | PRN

## 2023-09-14 NOTE — ED Triage Notes (Signed)
 Pt with LT rib area pain, intermittent and shooting since this morning; pt sts he feels something moving in the LT rib area; pt is very anxious ans is reporting tingling to bilateral hands

## 2023-09-14 NOTE — ED Provider Notes (Signed)
 [facility] Provider Note   CSN: [id number] Arrival date & time: 09/14/23  1501     History  Chief Complaint  Patient presents with   Chest Pain    Corey Freeman is a 33 y.o. male here for chest pain.  Began earlier this morning.  Worse with movement, deep breathing.  Pain located to left mid chest and goes into his upper chest.  He feels like his heart has been racing.  Feels anxious.  No history of PE or DVT.  No recent surgery, immobilization or malignancy.  Occasionally feels short of breath.  Was sick with GI loss about a month ago however no recent URI symptoms.  No sick contacts.  Eating and drinking normally.  No recent falls or injuries. No abd pain.  HPI     Home Medications Prior to Admission medications   Medication Sig Start Date End Date Taking? Authorizing Provider  cetirizine (ZYRTEC) 10 MG tablet Take 1 tablet (10 mg total) by mouth daily. 10/01/16   Trena Platt D, PA  ibuprofen (ADVIL,MOTRIN) 200 MG tablet Take 200 mg by mouth every 6 (six) hours as needed for moderate pain.    [provider]      Allergies    Patient has no known allergies.    Review of Systems   Review of Systems  Constitutional: Negative.   HENT: Negative.    Respiratory:  Positive for cough and shortness of breath.   Cardiovascular:  Negative for palpitations and leg swelling.       Pleuritic chest pain on left  Gastrointestinal: Negative.   Genitourinary: Negative.   Musculoskeletal: Negative.   Skin: Negative.   Neurological: Negative.   All other systems reviewed and are negative.   Physical Exam Updated Vital Signs BP 135/80   Pulse (!) 103   Temp 98.7 F (37.1 C)   Resp 20   Ht 5\' 11"  (1.803 m)   Wt 102.1 kg   SpO2 100%   BMI 31.38 kg/m  Physical Exam Vitals and nursing note reviewed.  Constitutional:      General: He is not in acute distress.    Appearance: He is well-developed. He is not  ill-appearing, toxic-appearing or diaphoretic.  HENT:     Head: Atraumatic.  Eyes:     Pupils: Pupils are equal, round, and reactive to light.  Cardiovascular:     Rate and Rhythm: Normal rate and regular rhythm.     Pulses:          Radial pulses are 2+ on the right side and 2+ on the left side.       Dorsalis pedis pulses are 2+ on the right side and 2+ on the left side.     Heart sounds: Normal heart sounds.  Pulmonary:     Effort: Pulmonary effort is normal. No respiratory distress.     Breath sounds: Normal breath sounds.  Chest:     Chest wall: Tenderness present.    Abdominal:     General: Bowel sounds are normal. There is no distension.     Palpations: Abdomen is soft.  Musculoskeletal:        General: Normal range of motion.     Cervical back: Normal range of motion and neck supple.     Right lower leg: No tenderness. No edema.     Left lower leg: No tenderness. No edema.  Skin:    General: Skin is warm and dry.  Capillary Refill: Capillary refill takes less than 2 seconds.     Comments: No overlying rashes or lesions surrounding area of tenderness.  Neurological:     General: No focal deficit present.     Mental Status: He is alert and oriented to person, place, and time.  Psychiatric:     Comments: Appears mildly anxious     ED Results / Procedures / Treatments   Labs (all labs ordered are listed, but only abnormal results are displayed) Labs Reviewed  BASIC METABOLIC PANEL WITH GFR - Abnormal; Notable for the following components:      Result Value   Potassium 3.1 (*)    CO2 20 (*)    Glucose, Bld 142 (*)    All other components within normal limits  CBC WITH DIFFERENTIAL/PLATELET  RAPID URINE DRUG SCREEN, HOSP PERFORMED  MAGNESIUM  TSH  TROPONIN I (HIGH SENSITIVITY)  TROPONIN I (HIGH SENSITIVITY)    EKG EKG Interpretation Date/Time:  Sunday September 14 2023 15:39:29 EDT Ventricular Rate:  93 PR Interval:  145 QRS Duration:  94 QT  Interval:  363 QTC Calculation: 452 R Axis:   49  Text Interpretation: Sinus rhythm Confirmed by Anders Simmonds [phone]) on 09/14/2023 5:58:18 PM  Radiology CT Angio Chest PE W and/or Wo Contrast Result Date: 09/14/2023 CLINICAL DATA:  Intermittent left rib pain since this morning, bilateral upper extremity tingling, anxiety EXAM: CT ANGIOGRAPHY CHEST WITH CONTRAST TECHNIQUE: Multidetector CT imaging of the chest was performed using the standard protocol during bolus administration of intravenous contrast. Multiplanar CT image reconstructions and MIPs were obtained to evaluate the vascular anatomy. RADIATION DOSE REDUCTION: This exam was performed according to the departmental dose-optimization program which includes automated exposure control, adjustment of the mA and/or kV according to patient size and/or use of iterative reconstruction technique. CONTRAST:  OMNIPAQUE IOHEXOL 350 MG/ML SOLN COMPARISON:  09/14/2023 FINDINGS: Cardiovascular: This is a technically adequate evaluation of the pulmonary vasculature. No filling defects or pulmonary emboli. The heart is unremarkable without pericardial effusion. No evidence of thoracic aortic aneurysm or dissection. Mediastinum/Nodes: No enlarged mediastinal, hilar, or axillary lymph nodes. Thyroid gland, trachea, and esophagus demonstrate no significant findings. Lungs/Pleura: No acute airspace disease, effusion, or pneumothorax. Central airways are patent. Upper Abdomen: No acute abnormality. Musculoskeletal: No acute or destructive bony abnormalities. Reconstructed images demonstrate no additional findings. Review of the MIP images confirms the above findings. IMPRESSION: 1. No evidence of pulmonary embolus. 2. No acute intrathoracic process. Electronically Signed   By: Sharlet Salina M.D.   On: 09/14/2023 16:53   DG Chest 2 View Result Date: 09/14/2023 CLINICAL DATA:  Chest pain. Left rib intermittent pain since this morning. No reported acute injury.  EXAM: CHEST - 2 VIEW COMPARISON:  Chest radiographs 10/30/2015 and 06/29/2015. FINDINGS: The heart size and mediastinal contours are normal. The lungs are clear. There is no pleural effusion or pneumothorax. No acute osseous findings are identified. IMPRESSION: Stable chest.  No evidence of active cardiopulmonary process. Electronically Signed   By: Carey Bullocks M.D.   On: 09/14/2023 16:03    Procedures Procedures    Medications Ordered in ED Medications  ketorolac (TORADOL) 15 MG/ML injection 15 mg (15 mg Intravenous Given 09/14/23 1525)  iohexol (OMNIPAQUE) 350 MG/ML injection 100 mL (100 mLs Intravenous Contrast Given 09/14/23 1639)    ED Course/ Medical Decision Making/ A&P   33 year old here for evaluation of left-sided chest pain.  Worse with movement, deep breathing.  He is afebrile, nonseptic, not ill-appearing.  Pain somewhat reproducible.  No recent trauma or injury.  No recent cough, fever, rhinorrhea.  No abdominal pain.  Mildly anxious in room, tachycardic.  Labs and imaging personally viewed and interpreted:  CBC without leukocytosis Metabolic panel potassium 3.1 Mag 2.0 Troponin less than 2 UDS negative Mag 2.0 Chest x-ray without significant abnormality EKG without ischemic changes CTA negative for acute abnormality  Patient reassessed.  Pain improved.  Discussed labs and imaging.  Follow-up outpatient, return for any worsening symptoms.  Low suspicion for acute ACS, PE, dissection, pneumothorax, pericarditis, endocarditis, myocarditis, acute intra-abdominal etiology, fracture, panic disorder  The patient has been appropriately medically screened and/or stabilized in the ED. I have low suspicion for any other emergent medical condition which would require further screening, evaluation or treatment in the ED or require inpatient management.  Patient is hemodynamically stable and in no acute distress.  Patient able to ambulate in department prior to ED.  Evaluation  does not show acute pathology that would require ongoing or additional emergent interventions while in the emergency department or further inpatient treatment.  I have discussed the diagnosis with the patient and answered all questions.  Pain is been managed while in the emergency department and patient has no further complaints prior to discharge.  Patient is comfortable with plan discussed in room and is stable for discharge at this time.  I have discussed strict return precautions for returning to the emergency department.  Patient was encouraged to follow-up with PCP/specialist refer to at discharge.                                 Medical Decision Making Amount and/or Complexity of Data Reviewed Independent Historian: friend External Data Reviewed: labs, radiology, ECG and notes. Labs: ordered. Decision-making details documented in ED Course. Radiology: ordered and independent interpretation performed. Decision-making details documented in ED Course. ECG/medicine tests: ordered and independent interpretation performed. Decision-making details documented in ED Course.  Risk OTC drugs. Prescription drug management. Parenteral controlled substances. Decision regarding hospitalization. Diagnosis or treatment significantly limited by social determinants of health.          Final Clinical Impression(s) / ED Diagnoses Final diagnoses:  Precordial pain    Rx / DC Orders ED Discharge Orders     None         Heinrich Fertig A, PA-C 09/14/23 1841    Anders Simmonds T, DO 09/15/23 1511

## 2023-09-14 NOTE — Discharge Instructions (Signed)
 It was a pleasure taking care of you here in the emergency department today  Your workup today was reassuring.  I would try Tylenol, Motrin as needed for pain  Make sure to follow-up outpatient, return for any worsening symptoms
